# Patient Record
Sex: Female | Born: 1935 | Race: White | Hispanic: No | Smoking: Never smoker
Health system: Southern US, Community
[De-identification: ages and names within clinical notes are randomized; demographics above are authoritative.]

## PROBLEM LIST (undated history)

## (undated) DIAGNOSIS — M1990 Unspecified osteoarthritis, unspecified site: Secondary | ICD-10-CM

## (undated) DIAGNOSIS — I10 Essential (primary) hypertension: Secondary | ICD-10-CM

## (undated) DIAGNOSIS — E785 Hyperlipidemia, unspecified: Secondary | ICD-10-CM

## (undated) DIAGNOSIS — J449 Chronic obstructive pulmonary disease, unspecified: Secondary | ICD-10-CM

## (undated) DIAGNOSIS — R233 Spontaneous ecchymoses: Secondary | ICD-10-CM

## (undated) DIAGNOSIS — I509 Heart failure, unspecified: Secondary | ICD-10-CM

## (undated) DIAGNOSIS — J4489 Other specified chronic obstructive pulmonary disease: Secondary | ICD-10-CM

## (undated) DIAGNOSIS — O2230 Deep phlebothrombosis in pregnancy, unspecified trimester: Secondary | ICD-10-CM

## (undated) DIAGNOSIS — I7300 Raynaud's syndrome without gangrene: Secondary | ICD-10-CM

## (undated) DIAGNOSIS — K5090 Crohn's disease, unspecified, without complications: Secondary | ICD-10-CM

## (undated) DIAGNOSIS — R238 Other skin changes: Secondary | ICD-10-CM

## (undated) DIAGNOSIS — F419 Anxiety disorder, unspecified: Secondary | ICD-10-CM

## (undated) DIAGNOSIS — R251 Tremor, unspecified: Secondary | ICD-10-CM

## (undated) DIAGNOSIS — R609 Edema, unspecified: Secondary | ICD-10-CM

## (undated) DIAGNOSIS — H9190 Unspecified hearing loss, unspecified ear: Secondary | ICD-10-CM

## (undated) DIAGNOSIS — R0689 Other abnormalities of breathing: Secondary | ICD-10-CM

## (undated) DIAGNOSIS — K219 Gastro-esophageal reflux disease without esophagitis: Secondary | ICD-10-CM

## (undated) HISTORY — PX: KNEE SURGERY: SHX244

## (undated) HISTORY — DX: Edema, unspecified: R60.9

## (undated) HISTORY — DX: Spontaneous ecchymoses: R23.3

## (undated) HISTORY — DX: Unspecified hearing loss, unspecified ear: H91.90

## (undated) HISTORY — DX: Other abnormalities of breathing: R06.89

## (undated) HISTORY — DX: Tremor, unspecified: R25.1

## (undated) HISTORY — DX: Other skin changes: R23.8

## (undated) HISTORY — PX: ABDOMINAL HYSTERECTOMY: SHX81

## (undated) HISTORY — PX: FOOT SURGERY: SHX648

---

## 2003-09-03 ENCOUNTER — Other Ambulatory Visit: Payer: Self-pay

## 2004-10-01 ENCOUNTER — Ambulatory Visit: Payer: Self-pay | Admitting: Ophthalmology

## 2004-10-08 ENCOUNTER — Ambulatory Visit: Payer: Self-pay | Admitting: Ophthalmology

## 2004-10-31 ENCOUNTER — Emergency Department: Payer: Self-pay | Admitting: Emergency Medicine

## 2005-01-30 ENCOUNTER — Ambulatory Visit: Payer: Self-pay | Admitting: Surgery

## 2005-01-30 ENCOUNTER — Other Ambulatory Visit: Payer: Self-pay

## 2005-01-31 ENCOUNTER — Ambulatory Visit: Payer: Self-pay | Admitting: Surgery

## 2006-04-16 ENCOUNTER — Emergency Department: Payer: Self-pay | Admitting: Emergency Medicine

## 2006-04-16 ENCOUNTER — Other Ambulatory Visit: Payer: Self-pay

## 2009-01-03 ENCOUNTER — Ambulatory Visit: Payer: Self-pay | Admitting: Ophthalmology

## 2009-01-17 ENCOUNTER — Ambulatory Visit: Payer: Self-pay | Admitting: Ophthalmology

## 2013-03-03 ENCOUNTER — Encounter: Payer: Self-pay | Admitting: Podiatry

## 2013-03-03 ENCOUNTER — Ambulatory Visit (INDEPENDENT_AMBULATORY_CARE_PROVIDER_SITE_OTHER): Payer: Medicare Other | Admitting: Podiatry

## 2013-03-03 VITALS — BP 109/55 | HR 71 | Resp 16 | Ht 63.0 in | Wt 220.0 lb

## 2013-03-03 DIAGNOSIS — M2040 Other hammer toe(s) (acquired), unspecified foot: Secondary | ICD-10-CM

## 2013-03-03 DIAGNOSIS — M7751 Other enthesopathy of right foot: Secondary | ICD-10-CM

## 2013-03-03 DIAGNOSIS — M7750 Other enthesopathy of unspecified foot: Secondary | ICD-10-CM

## 2013-03-03 DIAGNOSIS — B351 Tinea unguium: Secondary | ICD-10-CM

## 2013-03-03 NOTE — Progress Notes (Signed)
Victoria Johnston presents today with a chief complaint of painful hammertoe deformity second digit of the right foot she's also complaining of painful  elongated and sharply incurvated nails.  Objective: Pulses are palpable bilateral. Sharp incurvated nail margins are thick yellow dystrophic onychomycotic 1 through 5 bilateral. She also has a painfully rigid hammertoe deformity second digit of the right foot with overlying erythema and what appears to be a bursa at the second PIPJ.  Assessment: Painful capsulitis osteoarthritis second digit right. Pain in limb secondary to onychomycosis.  Plan: We discussed the etiology pathology conservative or surgical therapies. At this point we injected para-articular only about the second PIPJ with dexamethasone and local anesthetic. This rendered immediate relief to her. We also debrided her nails 1 through 5 bilateral is a covered service secondary to pain. Followup with her in 3 months.

## 2013-05-31 ENCOUNTER — Ambulatory Visit: Payer: Medicare Other | Admitting: Podiatry

## 2013-06-30 ENCOUNTER — Ambulatory Visit (INDEPENDENT_AMBULATORY_CARE_PROVIDER_SITE_OTHER): Payer: Medicare Other | Admitting: Podiatry

## 2013-06-30 VITALS — BP 105/58 | HR 83 | Resp 16 | Ht 64.0 in | Wt 204.0 lb

## 2013-06-30 DIAGNOSIS — B351 Tinea unguium: Secondary | ICD-10-CM

## 2013-06-30 DIAGNOSIS — M79609 Pain in unspecified limb: Secondary | ICD-10-CM

## 2013-06-30 NOTE — Progress Notes (Signed)
She presents today with a chief complaint of painful toenails one through 5 bilateral.  Objective: Vital signs are stable she is alert and oriented x3. Pulses are palpable bilateral. Nails are thick yellow dystrophic lytic mycotic and painful palpation. Assessment: Pain in limb secondary to onychomycosis 1 through 5 bilateral.  Plan: Debridement of nails 1 through 5 bilateral.

## 2013-10-06 ENCOUNTER — Ambulatory Visit: Payer: Medicare Other | Admitting: Podiatry

## 2015-01-20 ENCOUNTER — Inpatient Hospital Stay
Admission: EM | Admit: 2015-01-20 | Discharge: 2015-01-25 | DRG: 481 | Disposition: A | Discharge status: Skilled Nursing Facility | Payer: Medicare Other | Attending: Internal Medicine | Admitting: Internal Medicine

## 2015-01-20 ENCOUNTER — Emergency Department: Payer: Medicare Other

## 2015-01-20 DIAGNOSIS — M4806 Spinal stenosis, lumbar region: Secondary | ICD-10-CM | POA: Diagnosis present

## 2015-01-20 DIAGNOSIS — N390 Urinary tract infection, site not specified: Secondary | ICD-10-CM | POA: Diagnosis present

## 2015-01-20 DIAGNOSIS — Z79899 Other long term (current) drug therapy: Secondary | ICD-10-CM | POA: Diagnosis not present

## 2015-01-20 DIAGNOSIS — W19XXXA Unspecified fall, initial encounter: Secondary | ICD-10-CM | POA: Diagnosis present

## 2015-01-20 DIAGNOSIS — T148XXA Other injury of unspecified body region, initial encounter: Secondary | ICD-10-CM

## 2015-01-20 DIAGNOSIS — Z86718 Personal history of other venous thrombosis and embolism: Secondary | ICD-10-CM

## 2015-01-20 DIAGNOSIS — S72401A Unspecified fracture of lower end of right femur, initial encounter for closed fracture: Secondary | ICD-10-CM | POA: Diagnosis present

## 2015-01-20 DIAGNOSIS — Z90710 Acquired absence of both cervix and uterus: Secondary | ICD-10-CM

## 2015-01-20 DIAGNOSIS — G629 Polyneuropathy, unspecified: Secondary | ICD-10-CM | POA: Diagnosis present

## 2015-01-20 DIAGNOSIS — K219 Gastro-esophageal reflux disease without esophagitis: Secondary | ICD-10-CM | POA: Diagnosis present

## 2015-01-20 DIAGNOSIS — Z8249 Family history of ischemic heart disease and other diseases of the circulatory system: Secondary | ICD-10-CM

## 2015-01-20 DIAGNOSIS — Y792 Prosthetic and other implants, materials and accessory orthopedic devices associated with adverse incidents: Secondary | ICD-10-CM | POA: Diagnosis present

## 2015-01-20 DIAGNOSIS — D62 Acute posthemorrhagic anemia: Secondary | ICD-10-CM | POA: Diagnosis not present

## 2015-01-20 DIAGNOSIS — K5010 Crohn's disease of large intestine without complications: Secondary | ICD-10-CM | POA: Diagnosis present

## 2015-01-20 DIAGNOSIS — F0390 Unspecified dementia without behavioral disturbance: Secondary | ICD-10-CM | POA: Diagnosis present

## 2015-01-20 DIAGNOSIS — M1990 Unspecified osteoarthritis, unspecified site: Secondary | ICD-10-CM | POA: Diagnosis present

## 2015-01-20 DIAGNOSIS — E785 Hyperlipidemia, unspecified: Secondary | ICD-10-CM | POA: Diagnosis present

## 2015-01-20 DIAGNOSIS — T84049A Periprosthetic fracture around unspecified internal prosthetic joint, initial encounter: Secondary | ICD-10-CM | POA: Diagnosis present

## 2015-01-20 DIAGNOSIS — Z7901 Long term (current) use of anticoagulants: Secondary | ICD-10-CM | POA: Diagnosis not present

## 2015-01-20 DIAGNOSIS — I5022 Chronic systolic (congestive) heart failure: Secondary | ICD-10-CM | POA: Diagnosis present

## 2015-01-20 DIAGNOSIS — Y92009 Unspecified place in unspecified non-institutional (private) residence as the place of occurrence of the external cause: Secondary | ICD-10-CM | POA: Diagnosis not present

## 2015-01-20 DIAGNOSIS — S7290XA Unspecified fracture of unspecified femur, initial encounter for closed fracture: Secondary | ICD-10-CM | POA: Diagnosis present

## 2015-01-20 DIAGNOSIS — G250 Essential tremor: Secondary | ICD-10-CM | POA: Diagnosis present

## 2015-01-20 DIAGNOSIS — S7291XA Unspecified fracture of right femur, initial encounter for closed fracture: Secondary | ICD-10-CM

## 2015-01-20 DIAGNOSIS — E039 Hypothyroidism, unspecified: Secondary | ICD-10-CM | POA: Diagnosis present

## 2015-01-20 DIAGNOSIS — H9190 Unspecified hearing loss, unspecified ear: Secondary | ICD-10-CM | POA: Diagnosis present

## 2015-01-20 DIAGNOSIS — Z8781 Personal history of (healed) traumatic fracture: Secondary | ICD-10-CM

## 2015-01-20 DIAGNOSIS — Z9889 Other specified postprocedural states: Secondary | ICD-10-CM

## 2015-01-20 DIAGNOSIS — D684 Acquired coagulation factor deficiency: Secondary | ICD-10-CM | POA: Diagnosis present

## 2015-01-20 LAB — COMPREHENSIVE METABOLIC PANEL
ALT: 10 U/L — ABNORMAL LOW (ref 14–54)
AST: 15 U/L (ref 15–41)
Albumin: 2.5 g/dL — ABNORMAL LOW (ref 3.5–5.0)
Alkaline Phosphatase: 73 U/L (ref 38–126)
Anion gap: 3 — ABNORMAL LOW (ref 5–15)
BILIRUBIN TOTAL: 0.7 mg/dL (ref 0.3–1.2)
BUN: 17 mg/dL (ref 6–20)
CO2: 25 mmol/L (ref 22–32)
CREATININE: 1.19 mg/dL — AB (ref 0.44–1.00)
Calcium: 7.8 mg/dL — ABNORMAL LOW (ref 8.9–10.3)
Chloride: 110 mmol/L (ref 101–111)
GFR, EST AFRICAN AMERICAN: 46 mL/min — AB (ref >60)
GFR, EST NON AFRICAN AMERICAN: 39 mL/min — AB (ref >60)
Glucose, Bld: 119 mg/dL — ABNORMAL HIGH (ref 65–99)
POTASSIUM: 3.6 mmol/L (ref 3.5–5.1)
Sodium: 138 mmol/L (ref 135–145)
TOTAL PROTEIN: 3.9 g/dL — AB (ref 6.5–8.1)

## 2015-01-20 LAB — CBC WITH DIFFERENTIAL/PLATELET
BASOS ABS: 0 10*3/uL (ref 0–0.1)
Basophils Relative: 0 %
EOS ABS: 0 10*3/uL (ref 0–0.7)
EOS PCT: 0 %
HCT: 30.7 % — ABNORMAL LOW (ref 35.0–47.0)
Hemoglobin: 10.2 g/dL — ABNORMAL LOW (ref 12.0–16.0)
Lymphocytes Relative: 12 %
Lymphs Abs: 0.9 10*3/uL — ABNORMAL LOW (ref 1.0–3.6)
MCH: 33 pg (ref 26.0–34.0)
MCHC: 33.3 g/dL (ref 32.0–36.0)
MCV: 98.9 fL (ref 80.0–100.0)
Monocytes Absolute: 0.6 10*3/uL (ref 0.2–0.9)
Monocytes Relative: 9 %
Neutro Abs: 5.4 10*3/uL (ref 1.4–6.5)
Neutrophils Relative %: 79 %
PLATELETS: 107 10*3/uL — AB (ref 150–440)
RBC: 3.1 MIL/uL — AB (ref 3.80–5.20)
RDW: 13.6 % (ref 11.5–14.5)
WBC: 6.9 10*3/uL (ref 3.6–11.0)

## 2015-01-20 LAB — URINALYSIS COMPLETE WITH MICROSCOPIC (ARMC ONLY)
BILIRUBIN URINE: NEGATIVE
Glucose, UA: NEGATIVE mg/dL
HGB URINE DIPSTICK: NEGATIVE
KETONES UR: NEGATIVE mg/dL
NITRITE: NEGATIVE
PH: 5 (ref 5.0–8.0)
Protein, ur: 30 mg/dL — AB
Specific Gravity, Urine: 1.021 (ref 1.005–1.030)

## 2015-01-20 LAB — PROTIME-INR
INR: 1.97
Prothrombin Time: 22.6 seconds — ABNORMAL HIGH (ref 11.4–15.0)

## 2015-01-20 LAB — ABO/RH: ABO/RH(D): A POS

## 2015-01-20 MED ORDER — ACETAMINOPHEN 325 MG PO TABS: 650.0000 mg | ORAL_TABLET | Freq: Four times a day (QID) | ORAL | Status: DC | PRN

## 2015-01-20 MED ORDER — LORAZEPAM 1 MG PO TABS
1.0000 mg | ORAL_TABLET | Freq: Three times a day (TID) | ORAL | Status: DC | PRN
  Administered 2015-01-24: 1 mg via ORAL
  Filled 2015-01-20: qty 1

## 2015-01-20 MED ORDER — PANTOPRAZOLE SODIUM 40 MG PO TBEC
40.0000 mg | DELAYED_RELEASE_TABLET | Freq: Every day | ORAL | Status: DC
  Administered 2015-01-20 – 2015-01-21 (×2): 40 mg via ORAL
  Filled 2015-01-20 (×2): qty 1

## 2015-01-20 MED ORDER — SENNA 8.6 MG PO TABS
1.0000 | ORAL_TABLET | Freq: Two times a day (BID) | ORAL | Status: DC
  Administered 2015-01-20 – 2015-01-25 (×10): 8.6 mg via ORAL
  Filled 2015-01-20 (×10): qty 1

## 2015-01-20 MED ORDER — SODIUM CHLORIDE 0.9 % IV SOLN
INTRAVENOUS | Status: DC
  Administered 2015-01-20 – 2015-01-23 (×5): via INTRAVENOUS

## 2015-01-20 MED ORDER — MAGNESIUM OXIDE 400 (241.3 MG) MG PO TABS
400.0000 mg | ORAL_TABLET | Freq: Every day | ORAL | Status: DC
  Administered 2015-01-20 – 2015-01-25 (×5): 400 mg via ORAL
  Filled 2015-01-20 (×10): qty 1

## 2015-01-20 MED ORDER — ACETAMINOPHEN 650 MG RE SUPP: 650.0000 mg | Freq: Four times a day (QID) | RECTAL | Status: DC | PRN

## 2015-01-20 MED ORDER — TOPIRAMATE 100 MG PO TABS
100.0000 mg | ORAL_TABLET | Freq: Two times a day (BID) | ORAL | Status: DC
  Administered 2015-01-20 – 2015-01-22 (×4): 100 mg via ORAL
  Filled 2015-01-20 (×6): qty 1

## 2015-01-20 MED ORDER — POLYETHYLENE GLYCOL 3350 17 G PO PACK: 17.0000 g | PACK | Freq: Every day | ORAL | Status: DC | PRN

## 2015-01-20 MED ORDER — HYDROCODONE-ACETAMINOPHEN 5-325 MG PO TABS
0.5000 | ORAL_TABLET | Freq: Every morning | ORAL | Status: DC
  Filled 2015-01-20: qty 1

## 2015-01-20 MED ORDER — TRAMADOL HCL 50 MG PO TABS
50.0000 mg | ORAL_TABLET | Freq: Two times a day (BID) | ORAL | Status: DC
  Administered 2015-01-20 (×2): 50 mg via ORAL
  Filled 2015-01-20 (×3): qty 1

## 2015-01-20 MED ORDER — SODIUM CHLORIDE 0.9 % IJ SOLN
10.0000 mL | Freq: Two times a day (BID) | INTRAMUSCULAR | Status: DC
  Administered 2015-01-21 – 2015-01-25 (×6): 10 mL via INTRAVENOUS

## 2015-01-20 MED ORDER — PROPRANOLOL HCL 40 MG PO TABS
40.0000 mg | ORAL_TABLET | Freq: Two times a day (BID) | ORAL | Status: DC
  Administered 2015-01-20 – 2015-01-24 (×8): 40 mg via ORAL
  Filled 2015-01-20 (×11): qty 1

## 2015-01-20 MED ORDER — HALOPERIDOL 2 MG PO TABS
1.0000 mg | ORAL_TABLET | Freq: Every day | ORAL | Status: DC
  Administered 2015-01-20 – 2015-01-24 (×5): 1 mg via ORAL
  Filled 2015-01-20 (×3): qty 1
  Filled 2015-01-20: qty 0.5
  Filled 2015-01-20 (×2): qty 1

## 2015-01-20 MED ORDER — FOLIC ACID 1 MG PO TABS
1.0000 mg | ORAL_TABLET | Freq: Every day | ORAL | Status: DC
  Administered 2015-01-20 – 2015-01-25 (×5): 1 mg via ORAL
  Filled 2015-01-20 (×5): qty 1

## 2015-01-20 MED ORDER — SULFASALAZINE 500 MG PO TABS
500.0000 mg | ORAL_TABLET | Freq: Two times a day (BID) | ORAL | Status: DC
  Administered 2015-01-20 – 2015-01-25 (×9): 500 mg via ORAL
  Filled 2015-01-20 (×11): qty 1

## 2015-01-20 MED ORDER — MORPHINE SULFATE (PF) 2 MG/ML IV SOLN: 1.0000 mg | INTRAVENOUS | Status: DC | PRN

## 2015-01-20 MED ORDER — DOCUSATE SODIUM 100 MG PO CAPS
100.0000 mg | ORAL_CAPSULE | Freq: Two times a day (BID) | ORAL | Status: DC
  Administered 2015-01-20 – 2015-01-25 (×10): 100 mg via ORAL
  Filled 2015-01-20 (×10): qty 1

## 2015-01-20 MED ORDER — HYDROMORPHONE HCL 1 MG/ML IJ SOLN
0.5000 mg | INTRAMUSCULAR | Status: DC | PRN
  Administered 2015-01-20: 0.5 mg via INTRAVENOUS
  Filled 2015-01-20 (×4): qty 1

## 2015-01-20 MED ORDER — MORPHINE SULFATE (PF) 2 MG/ML IV SOLN
2.0000 mg | INTRAVENOUS | Status: DC | PRN
  Administered 2015-01-20 – 2015-01-21 (×4): 2 mg via INTRAVENOUS
  Filled 2015-01-20 (×4): qty 1

## 2015-01-20 MED ORDER — HYDROMORPHONE HCL 1 MG/ML IJ SOLN
0.5000 mg | Freq: Once | INTRAMUSCULAR | Status: AC
  Administered 2015-01-20: 0.5 mg via INTRAMUSCULAR

## 2015-01-20 MED ORDER — NITROGLYCERIN 0.4 MG SL SUBL: 0.4000 mg | SUBLINGUAL_TABLET | SUBLINGUAL | Status: DC | PRN

## 2015-01-20 MED ORDER — LEVOTHYROXINE SODIUM 75 MCG PO TABS
75.0000 ug | ORAL_TABLET | Freq: Every day | ORAL | Status: DC
  Administered 2015-01-23 – 2015-01-24 (×2): 75 ug via ORAL
  Filled 2015-01-20 (×2): qty 1

## 2015-01-20 NOTE — H&P (Signed)
[facility] - [place] at [facility]   PATIENT NAME: Victoria Johnston    MR#:  [id number]  DATE OF BIRTH:  [birth year]  DATE OF ADMISSION:  01/20/2015  PRIMARY CARE PHYSICIAN: Danella Penton., MD   REQUESTING/REFERRING PHYSICIAN: DR Carollee Massed  CHIEF COMPLAINT:   Right knee pain after fall at home today HISTORY OF PRESENT ILLNESS:  Victoria Johnston  is a 81 y.o. female with a known history of hypothyroidism, lumbar spinal stenosis, hyperlipidemia, Crohn's disease, GERD, DVT on Coumadin, chronic systolic congestive heart failure comes to the emergency room from home accompanied by her daughter after she had a mechanical fall at home. Patient's daughter reports patient was trying to get to her walker likely tangled her foot fell down and came to the emergency room with right knee pain but was found to have distal femur fracture. Patient has bilateral knee prosthesis secondary to DJD. She is on Coumadin due to her DVT. Her INR is 1.9. Patient denies any chest pain shortness of breath. She was in her usual normal routine.  PAST MEDICAL HISTORY:   Past Medical History  Diagnosis Date  . Swelling   . Hearing loss   . Difficulty breathing   . Bruises easily   . Tremors of nervous system     PAST SURGICAL HISTOIRY:   Past Surgical History  Procedure Laterality Date  . Abdominal hysterectomy    . Knee surgery Bilateral   . Foot surgery Bilateral     SOCIAL HISTORY:   Social History  Substance Use Topics  . Smoking status: Never Smoker   . Smokeless tobacco: Never Used  . Alcohol Use: No    FAMILY HISTORY:  HTN  DRUG ALLERGIES:  Obtained from [facility]  Codeine, Lexapro, Lyrica, Mysoline  REVIEW OF SYSTEMS:  Review of Systems  Constitutional: Negative for fever, chills and diaphoresis.  HENT: Negative for congestion, ear pain, hearing loss, nosebleeds and sore throat.   Eyes: Negative for blurred vision, double vision, photophobia and  pain.  Respiratory: Negative for sputum production, shortness of breath, wheezing and stridor.   Cardiovascular: Negative for chest pain, orthopnea, claudication and leg swelling.  Gastrointestinal: Negative for heartburn and abdominal pain.  Genitourinary: Negative for dysuria and frequency.  Musculoskeletal: Positive for joint pain and falls. Negative for back pain and neck pain.  Skin: Negative for rash.  Neurological: Positive for weakness. Negative for tingling, sensory change, speech change, focal weakness, seizures and headaches.  Endo/Heme/Allergies: Does not bruise/bleed easily.  Psychiatric/Behavioral: Negative for suicidal ideas, memory loss and substance abuse. The patient is not nervous/anxious.   All other systems reviewed and are negative.    MEDICATIONS AT HOME:   Prior to Admission medications   Medication Sig Start Date End Date Taking? Authorizing Provider  ALPRAZolam Prudy Feeler) 0.5 MG tablet  02/20/13   Historical Provider, MD  HYDROcodone-acetaminophen (NORCO/VICODIN) 5-325 MG per tablet  02/24/13   Historical Provider, MD  KLOR-CON M20 20 MEQ tablet  01/14/13   Historical Provider, MD  pantoprazole (PROTONIX) 40 MG tablet  01/25/13   Historical Provider, MD  propranolol (INDERAL) 40 MG tablet  01/01/13   Historical Provider, MD  sulfaSALAzine (AZULFIDINE) 500 MG tablet  02/25/13   Historical Provider, MD  topiramate (TOPAMAX) 100 MG tablet  02/12/13   Historical Provider, MD  torsemide (DEMADEX) 20 MG tablet  01/17/13   Historical Provider, MD  traMADol Janean Sark) 50 MG tablet  02/04/13   Historical Provider, MD  warfarin (COUMADIN) 5 MG tablet  02/20/13   Historical Provider, MD      VITAL SIGNS:  Blood pressure 112/41, pulse 60, temperature 98.3 F (36.8 C), temperature source Oral, resp. rate 16, height  (1.702 m), weight 90.719 kg (200 lb), SpO2 95 %.  PHYSICAL EXAMINATION:  GENERAL:  81 y.o.-year-old patient lying in the bed with no acute distress.  EYES:  Pupils equal, round, reactive to light and accommodation. No scleral icterus. Extraocular muscles intact.  HEENT: Head atraumatic, normocephalic. Oropharynx and nasopharynx clear.  NECK:  Supple, no jugular venous distention. No thyroid enlargement, no tenderness.  LUNGS: Normal breath sounds bilaterally, no wheezing, rales,rhonchi or crepitation. No use of accessory muscles of respiration.  CARDIOVASCULAR: S1, S2 normal. No murmurs, rubs, or gallops.  ABDOMEN: Soft, nontender, nondistended. Bowel sounds present. No organomegaly or mass.  EXTREMITIES: chronic+ pedal edema, cyanosis, or clubbing. Foot defromities-chronic Right knee swollen NEUROLOGIC: grossly intact no focal deficit PSYCHIATRIC: The patient is alert and oriented x 3.  SKIN: No obvious rash, lesion, or ulcer.   LABORATORY PANEL:   CBC  Recent Labs Lab 01/20/15 1025  WBC 6.9  HGB 10.2*  HCT 30.7*  PLT 107*   ------------------------------------------------------------------------------------------------------------------  Chemistries   Recent Labs Lab 01/20/15 1025  NA 138  K 3.6  CL 110  CO2 25  GLUCOSE 119*  BUN 17  CREATININE 1.19*  CALCIUM 7.8*  AST 15  ALT 10*  ALKPHOS 73  BILITOT 0.7   ------------------------------------------------------------------------------------------------------------------  Cardiac Enzymes No results for input(s): TROPONINI in the last 168 hours. ------------------------------------------------------------------------------------------------------------------  RADIOLOGY:  Dg Knee 1-2 Views Right  01/20/2015   CLINICAL DATA:  Status post fall today. Right knee pain Initial encounter.  EXAM: RIGHT KNEE - 1-2 VIEW  COMPARISON:  None.  FINDINGS: The patient has a right total knee arthroplasty in place. There is a fracture of the distal right femur which is oblique in orientation extending from approximately 17 cm above the lateral femoral condyle to the level of the joint  replacement on the medial side. There is approximately 1/2 shaft width posterior displacement of the distal fragment and fragment override of 2.5 cm. Bones are osteopenic.  IMPRESSION: Acute periprosthetic distal right femur fracture as described.  Osteopenia.   Electronically Signed   By: Drusilla Kanner M.D.   On: 01/20/2015 12:27   Dg Hip Unilat  With Pelvis 2-3 Views Right  01/20/2015   CLINICAL DATA:  Patient brought in via EMS from home. Patient was walking back from bathroom and fell. Fall was unwitnessed. Patient is alert and oriented and denies LOC. Right hip in shorter than left and externally rotated.  EXAM: DG HIP (WITH OR WITHOUT PELVIS) 2-3V RIGHT  COMPARISON:  None.  FINDINGS: There is no convincing fracture. There is no bone lesion. Bones diffusely demineralized.  Hip joints, SI joints and symphysis pubis are normally aligned.  IMPRESSION: No fracture or dislocation.   Electronically Signed   By: Amie Portland M.D.   On: 01/20/2015 11:00    EKG:  Sinus bradycardia HR 57 q waves inferior lead  IMPRESSION AND PLAN:   81 year old Victoria Johnston with past medical history of hyperlipidemia, DJD, DVT on Coumadin, systolic congestive heart failure chronic comes to the emergency room after she had a mechanical fall at home she is being admitted with  1. Acute right distal femur fracture, periprosthetic status post mechanical fall at home. Orthopedic Dr. Rosita Kea has been informed. Risks and benefits discussed with patient and patient's daughter was present emergency room.  Patient has had several orthopedic surgeries in the past. She had no issues with anesthesia or postop complications. Patient is at intermediate risk for surgery. IV when necessary pain meds.  2. Acquired coagulopathy secondary to history of DVT. INR is 1.9. We'll hold off on Coumadin. Hoping INR will drift down by tomorrow. PT/INR in the morning.  3. Chronic systolic congestive heart failure. Patient currently is not in  heart failure Sats are 99% on room air We'll continue her cardiac meds.  4. Chronic essential tremors  5. Hypothyroidism continue Synthroid  6. DVT prophylaxis per orthopedic patient currently is on Coumadin and will keep that on hold. Resume postop when okay with orthopedic     All the records are reviewed and case discussed with ED provider. Management plans discussed with the patient, family and they are in agreement.  CODE STATUS: Full  TOTAL TIME TAKING CARE OF THIS PATIENT: 50 minutes.    Yailine Ballard M.D on 01/20/2015 at 12:55 PM  Between 7am to 6pm - Pager - [phone]  After 6pm go to [email or web address] - password [email or web address] [facility]  [facility]  Office  [phone]  CC: Primary care physician; Danella Penton., MD

## 2015-01-20 NOTE — Progress Notes (Signed)
Pt iv site infiltrated d/c IV, attempted to restart IV. No access obtained using vein finder. Received order for PICC line.

## 2015-01-20 NOTE — ED Notes (Signed)
Patient brought in via EMS from home.  Patient was walking back from bathroom and fell.  Fall was unwitnessed.  Patient is alert and oriented and denies LOC.  Right hip in shorter than left and externally rotated.

## 2015-01-20 NOTE — Progress Notes (Signed)
Dr. Allena Katz ordered midline and d/c'd PICC line

## 2015-01-20 NOTE — Progress Notes (Signed)
Pts. Having severe pain and no IV site. Dr. Allena Katz ordered Dilaudid 0.5mg  IM one time.

## 2015-01-20 NOTE — ED Provider Notes (Signed)
[facility] Emergency Department Provider Note  ____________________________________________  Time seen: 1055  I have reviewed the triage vital signs and the nursing notes.   HISTORY  Chief Complaint Fall     HPI Victoria Johnston is a 81 y.o. female who fell this morning at her home.She was walking back from the bathroom and fell. She lives at home with support from her daughter and son. The son covers the night shift and the daughter looks after her mother during the day. The daughter is here now and able to provide good history. She reports her mother has been doing okay, she is fairly independent, they're able to get out of the house and Novocain W the other day. She sometimes uses a walker.  Patient is complaining of pain in the right leg, specifically her right knee on my exam.  The patient denies hitting her head or losing consciousness during this fall.  She is notably tremulous, which the daughter reports is not unusual for her.    Past Medical History  Diagnosis Date  . Swelling   . Hearing loss   . Difficulty breathing   . Bruises easily   . Tremors of nervous system     There are no active problems to display for this patient.   Past Surgical History  Procedure Laterality Date  . Abdominal hysterectomy    . Knee surgery Bilateral   . Foot surgery Bilateral     Current Outpatient Rx  Name  Route  Sig  Dispense  Refill  . ALPRAZolam (XANAX) 0.5 MG tablet               . HYDROcodone-acetaminophen (NORCO/VICODIN) 5-325 MG per tablet               . KLOR-CON M20 20 MEQ tablet               . pantoprazole (PROTONIX) 40 MG tablet               . propranolol (INDERAL) 40 MG tablet               . sulfaSALAzine (AZULFIDINE) 500 MG tablet               . topiramate (TOPAMAX) 100 MG tablet               . torsemide (DEMADEX) 20 MG tablet               . traMADol (ULTRAM) 50 MG tablet                . warfarin (COUMADIN) 5 MG tablet                 Allergies Review of patient's allergies indicates no known allergies.  History reviewed. No pertinent family history.  Social History Social History  Substance Use Topics  . Smoking status: Never Smoker   . Smokeless tobacco: Never Used  . Alcohol Use: No    Review of Systems  Constitutional: Negative for fever. ENT: Negative for sore throat. Cardiovascular: Negative for chest pain. Respiratory: Negative for shortness of breath. Gastrointestinal: Negative for abdominal pain, vomiting and diarrhea. Genitourinary: Negative for dysuria. Musculoskeletal: Pain right knee. See history of present illness Skin: Negative for rash. Neurological: Negative for headaches. Notable for tremor. See history of present illness   10-point ROS otherwise negative.  ____________________________________________   PHYSICAL EXAM:  VITAL SIGNS: ED Triage Vitals  Enc Vitals Group  BP --      Pulse --      Resp --      Temp --      Temp src --      SpO2 --      Weight --      Height --      Head Cir --      Peak Flow --      Pain Score --      Pain Loc --      Pain Edu? --      Excl. in GC? --     Constitutional:  Alert and oriented. Well appearing and in no distress. ENT   Head: Normocephalic and atraumatic.   Nose: No congestion/rhinnorhea.   Mouth/Throat: Mucous membranes are moist. Cardiovascular: Normal rate, regular rhythm, no murmur noted Respiratory:  Normal respiratory effort, no tachypnea.    Breath sounds are clear and equal bilaterally.  Gastrointestinal: Soft and nontender. No distention.  Back: No muscle spasm, no tenderness, no CVA tenderness. Musculoskeletal: No deformity noted. Nontender with normal range of motion in all extremities.  No noted edema. Neurologic:  Normal speech and language. No gross focal neurologic deficits are appreciated.  Skin:  Skin is warm, dry. No rash  noted. Psychiatric: Mood and affect are normal. Speech and behavior are normal.  ____________________________________________    LABS (pertinent positives/negatives)  Insert the labs.  ____________________________________________   EKG   ____________________________________________    RADIOLOGY  Right hip: IMPRESSION: No fracture or dislocation  Right knee: Fracture, oblique and somewhat displaced, distal right femur. Prior knee replacement is noted.  IJanalyn Harder, personally looked at this film for interpretation.  ____________________________________________   PROCEDURES    ____________________________________________   INITIAL IMPRESSION / ASSESSMENT AND PLAN / ED COURSE  Pertinent labs & imaging results that were available during my care of the patient were reviewed by me and considered in my medical decision making (see chart for details).   Alert 81 year old female with notable tremor. She fell this morning and has pain in her right knee. Initially, with the complaint of pain in the right hip and with rotation of the leg, and x-ray of the right hip was performed. This is normal. Will add on the x-ray for the right knee now.  ----------------------------------------- 12:30 PM on 01/20/2015 -----------------------------------------  The patient has a fracture of the distal femur. I have called and spoken with the nurse in the operating room where Dr. Rosita Kea is an left details for the consultation.  He has requested that I consult medicine for the admission itself.  I have discussed case with the patient's daughter as well. We will allow her to take her regular medicines and begin some pain medication.  I discussed the case with Dr. Allena Katz for admission.  ____________________________________________   FINAL CLINICAL IMPRESSION(S) / ED DIAGNOSES  Final diagnoses:  Femur fracture, right, closed, initial encounter   fall    Darien Ramus,  MD 01/20/15 [phone]

## 2015-01-20 NOTE — Consult Note (Signed)
Right periprosthetic distal femur fracture, on Coumadin. Plan ORIF when protime closer to normal. Hope for Sunday.

## 2015-01-21 LAB — PROTIME-INR
INR: 2.38
PROTHROMBIN TIME: 26.1 s — AB (ref 11.4–15.0)

## 2015-01-21 MED ORDER — HYDROMORPHONE HCL 1 MG/ML IJ SOLN
0.5000 mg | Freq: Once | INTRAMUSCULAR | Status: AC
  Administered 2015-01-21: 0.5 mg via INTRAVENOUS

## 2015-01-21 MED ORDER — VITAMIN K1 10 MG/ML IJ SOLN
10.0000 mg | Freq: Once | INTRAVENOUS | Status: AC
  Administered 2015-01-21: 10 mg via INTRAVENOUS
  Filled 2015-01-21: qty 1

## 2015-01-21 MED ORDER — TRAMADOL HCL 50 MG PO TABS
50.0000 mg | ORAL_TABLET | Freq: Four times a day (QID) | ORAL | Status: DC | PRN
  Administered 2015-01-21: 50 mg via ORAL
  Filled 2015-01-21: qty 1

## 2015-01-21 NOTE — Progress Notes (Signed)
Coming on to my shift, pt. Was in severe pain in left leg with no IV site or no PO pain meds. Dr. Allena Katz was notified and gave order to give pt. Dilaudid 0.5mg  IM once. PICC team is to be here at anytime to place midline cath. In pt. Pt. Resting quietly after IV pain medication.

## 2015-01-21 NOTE — Progress Notes (Signed)
Pts. IV line was unhooked at the end and I didn't realize this when I pushed the dilaudid and it wasted on the sheets.

## 2015-01-21 NOTE — Progress Notes (Signed)
INR increased today.  Recheck in am.  Plan surgery tomorrow if INR<1.4.

## 2015-01-21 NOTE — Progress Notes (Signed)
[facility] - [place] at [facility]                                                                                                                                                                                            Patient Demographics   Victoria Johnston, is a 81 y.o. female, DOB - Apr 07, 1924, ION:[id number]  Admit date - 01/20/2015   Admitting Physician Enedina Finner, MD  Outpatient Primary MD for the patient is Danella Penton., MD   LOS - 1  Subjective: Patient complains of pain in the leg but denies any other complaints no chest pain or shortness of breath     Review of Systems:   CONSTITUTIONAL: No documented fever. No fatigue, weakness. No weight gain, no weight loss.  EYES: No blurry or double vision.  ENT: No tinnitus. No postnasal drip. No redness of the oropharynx.  RESPIRATORY: No cough, no wheeze, no hemoptysis. No dyspnea.  CARDIOVASCULAR: No chest pain. No orthopnea. No palpitations. No syncope.  GASTROINTESTINAL: No nausea, no vomiting or diarrhea. No abdominal pain. No melena or hematochezia.  GENITOURINARY: No dysuria or hematuria.  ENDOCRINE: No polyuria or nocturia. No heat or cold intolerance.  HEMATOLOGY: No anemia. No bruising. No bleeding.  INTEGUMENTARY: No rashes. No lesions.  MUSCULOSKELETAL: No arthritis. No swelling. No gout.  NEUROLOGIC: No numbness, tingling, or ataxia. No seizure-type activity.  PSYCHIATRIC: No anxiety. No insomnia. No ADD.    Vitals:   Filed Vitals:   01/20/15 2121 01/21/15 0432 01/21/15 0749 01/21/15 0939  BP:  110/49 103/35 119/50  Pulse: 58 58 55 58  Temp:  99.7 F (37.6 C) 99.1 F (37.3 C)   TempSrc:  Oral Oral   Resp:  20 18   Height:      Weight:      SpO2: 100% 99% 100%     Wt Readings from Last 3 Encounters:  01/20/15 90.719 kg (200 lb)  06/30/13 92.534 kg (204 lb)  03/03/13 99.791 kg (220 lb)     Intake/Output Summary (Last 24 hours) at 01/21/15 1223 Last data filed at 01/21/15  0900  Gross per 24 hour  Intake 1217.5 ml  Output    750 ml  Net  467.5 ml    Physical Exam:   GENERAL: Pleasant-appearing in no apparent distress.  HEAD, EYES, EARS, NOSE AND THROAT: Atraumatic, normocephalic. Extraocular muscles are intact. Pupils equal and reactive to light. Sclerae anicteric. No conjunctival injection. No oro-pharyngeal erythema.  NECK: Supple. There is no jugular venous distention. No bruits, no lymphadenopathy, no thyromegaly.  HEART: Regular rate and rhythm,. No murmurs, no rubs, no clicks.  LUNGS: Clear to auscultation bilaterally. No rales or rhonchi. No wheezes.  ABDOMEN: Soft, flat, nontender, nondistended. Has good bowel sounds. No hepatosplenomegaly appreciated.  EXTREMITIES: No evidence of any cyanosis, clubbing, or peripheral edema.  +2 pedal and radial pulses bilaterally.  NEUROLOGIC: The patient is alert, awake, and oriented x3 with no focal motor or sensory deficits appreciated bilaterally.  SKIN: Moist and warm with no rashes appreciated.  Psych: Not anxious, depressed LN: No inguinal LN enlargement    Antibiotics   Anti-infectives    None      Medications   Scheduled Meds: . docusate sodium  100 mg Oral BID  . folic acid  1 mg Oral Daily  . haloperidol  1 mg Oral QHS  . levothyroxine  75 mcg Oral QAC breakfast  . magnesium oxide  400 mg Oral Daily  . pantoprazole  40 mg Oral Daily  . phytonadione (VITAMIN K) IV  10 mg Intravenous Once  . propranolol  40 mg Oral BID  . senna  1 tablet Oral BID  . sodium chloride  10 mL Intravenous Q12H  . sulfaSALAzine  500 mg Oral BID  . topiramate  100 mg Oral BID   Continuous Infusions: . sodium chloride 75 mL/hr at 01/21/15 1022   PRN Meds:.acetaminophen **OR** acetaminophen, HYDROmorphone (DILAUDID) injection, LORazepam, morphine injection, nitroGLYCERIN, polyethylene glycol, traMADol   Data Review:   Micro Results No results found for this or any previous visit (from the past 240  hour(s)).  Radiology Reports Dg Knee 1-2 Views Right  01/20/2015   CLINICAL DATA:  Status post fall today. Right knee pain Initial encounter.  EXAM: RIGHT KNEE - 1-2 VIEW  COMPARISON:  None.  FINDINGS: The patient has a right total knee arthroplasty in place. There is a fracture of the distal right femur which is oblique in orientation extending from approximately 17 cm above the lateral femoral condyle to the level of the joint replacement on the medial side. There is approximately 1/2 shaft width posterior displacement of the distal fragment and fragment override of 2.5 cm. Bones are osteopenic.  IMPRESSION: Acute periprosthetic distal right femur fracture as described.  Osteopenia.   Electronically Signed   By: Drusilla Kanner M.D.   On: 01/20/2015 12:27   Dg Chest Port 1 View  01/20/2015   CLINICAL DATA:  Preop chest radiograph prior to femur surgery.  EXAM: PORTABLE CHEST 1 VIEW  COMPARISON:  04/16/2006  FINDINGS: Cardiac silhouette is mildly enlarged. No convincing mediastinal or hilar masses or evidence of adenopathy.  Lungs are clear.  No pleural effusion or pneumothorax.  Bony thorax is diffusely demineralized but grossly intact.  IMPRESSION: No acute cardiopulmonary disease.   Electronically Signed   By: Amie Portland M.D.   On: 01/20/2015 13:08   Dg Hip Unilat  With Pelvis 2-3 Views Right  01/20/2015   CLINICAL DATA:  Patient brought in via EMS from home. Patient was walking back from bathroom and fell. Fall was unwitnessed. Patient is alert and oriented and denies LOC. Right hip in shorter than left and externally rotated.  EXAM: DG HIP (WITH OR WITHOUT PELVIS) 2-3V RIGHT  COMPARISON:  None.  FINDINGS: There is no convincing fracture. There is no bone lesion. Bones diffusely demineralized.  Hip joints, SI joints and symphysis pubis are normally aligned.  IMPRESSION: No fracture or dislocation.   Electronically Signed   By: Amie Portland M.D.   On: 01/20/2015 11:00     CBC  Recent  Labs Lab  01/20/15 1025  WBC 6.9  HGB 10.2*  HCT 30.7*  PLT 107*  MCV 98.9  MCH 33.0  MCHC 33.3  RDW 13.6  LYMPHSABS 0.9*  MONOABS 0.6  EOSABS 0.0  BASOSABS 0.0    Chemistries   Recent Labs Lab 01/20/15 1025  NA 138  K 3.6  CL 110  CO2 25  GLUCOSE 119*  BUN 17  CREATININE 1.19*  CALCIUM 7.8*  AST 15  ALT 10*  ALKPHOS 73  BILITOT 0.7   ------------------------------------------------------------------------------------------------------------------ estimated creatinine clearance is 37 mL/min (by C-G formula based on Cr of 1.19). ------------------------------------------------------------------------------------------------------------------ No results for input(s): HGBA1C in the last 72 hours. ------------------------------------------------------------------------------------------------------------------ No results for input(s): CHOL, HDL, LDLCALC, TRIG, CHOLHDL, LDLDIRECT in the last 72 hours. ------------------------------------------------------------------------------------------------------------------ No results for input(s): TSH, T4TOTAL, T3FREE, THYROIDAB in the last 72 hours.  Invalid input(s): FREET3 ------------------------------------------------------------------------------------------------------------------ No results for input(s): VITAMINB12, FOLATE, FERRITIN, TIBC, IRON, RETICCTPCT in the last 72 hours.  Coagulation profile  Recent Labs Lab 01/20/15 1025 01/21/15 0438  INR 1.97 2.38    No results for input(s): DDIMER in the last 72 hours.  Cardiac Enzymes No results for input(s): CKMB, TROPONINI, MYOGLOBIN in the last 168 hours.  Invalid input(s): CK ------------------------------------------------------------------------------------------------------------------ Invalid input(s): POCBNP    Assessment & Plan   81 year old Mrs. Fehnel with past medical history of hyperlipidemia, DJD, DVT on Coumadin, systolic congestive heart  failure chronic comes to the emergency room after she had a mechanical fall at home she is being admitted with  1. Acute right distal femur fracture, periprosthetic status post mechanical fall at home. Once INR is acceptable orthopedic is planning to operate on the patient.    2. Acquired coagulopathy secondary to history of DVT. INR is actually higher even after holding Coumadin I will give dose of vitamin K follow INR in the a.m.  3. Chronic systolic congestive heart failure. Patient currently is not in heart failure Sats are 99% on room air We'll continue her cardiac meds.  4. Chronic essential tremors  5. Hypothyroidism continue Synthroid  6. DVT prophylaxis per orthopedic postop recommend resuming her Coumadin     Code Status Orders        Start     Ordered   01/20/15 1427  Full code   Continuous     01/20/15 1426           Consults  orthopedic   DVT Prophylaxis  SCDs  Lab Results  Component Value Date   PLT 107* 01/20/2015     Time Spent in minutes   35 minutes Auburn Bilberry M.D on 01/21/2015 at 12:23 PM  Between 7am to 6pm - Pager - [phone]  After 6pm go to [email or web address] - password [email or web address] [facility]  [facility]   Office  [phone]

## 2015-01-21 NOTE — Progress Notes (Signed)
PT Cancellation Note  Patient Details Name: Victoria Johnston MRN: [id number] DOB: 1923-11-03   Cancelled Treatment:    Reason Eval/Treat Not Completed: Medical issues which prohibited therapy Pt awaiting ORIF surgery on Sunday.  Awaiting new orders with weight bearing precautions.  Gisele A Long 01/21/2015, 9:12 AM

## 2015-01-21 NOTE — Progress Notes (Signed)
Alert and oriented. VSS. Pt. Has had a very restless night with c/o severe pain in the left leg. Pain has been treated with IV pain meds and repositioning the pt. Pills whole with water. Foley patent with good output. O2 at 2 liters chronic. Pt. Has knee immobilizer to right lower extremity. Heels elevated off of bed with towel rolls. Scheduled for surgery on Sunday. Resting quietly at this time.

## 2015-01-21 NOTE — Clinical Social Work Note (Signed)
Clinical Social Work Assessment  Patient Details  Name: Victoria Johnston MRN: [id number] Date of Birth: July 26, 1923  Date of referral:  01/21/15               Reason for consult:  Facility Placement                Permission sought to share information with:  Facility Medical sales representative, Family Supports Permission granted to share information::  Yes, Verbal Permission Granted  Name::     Younger sister [phone].  Agency::  [facility]   Relationship::     Contact Information:     Housing/Transportation Living arrangements for the past 2 months:  Single Family Home Source of Information:  Patient, Other (Comment Required) (sibling) Patient Interpreter Needed:  None Criminal Activity/Legal Involvement Pertinent to Current Situation/Hospitalization:  No - Comment as needed Significant Relationships:  Adult Children, Siblings Lives with:  Adult Children, Other (Comment) (and adult grandson but he workd 3rd shift and sleeps most of the day) Do you feel safe going back to the place where you live?  No Need for family participation in patient care:  Yes (Comment)  Care giving concerns:  Current concerns are that pt may need to go into LTC after rehab.  Per pt's sister, pt had had more than one fall at home.     Social Worker assessment / plan:  CSW spoke to pt's sister and pt.  Pt is hard of hearing but oriented.  They are both in agreement with SNF placement.  Pt had previously been to [facility], and would like to return if possible.  Per pt's sister pt lives with her daughter and her grandson.  Pt's sister expressed that she feels that pt needs more support than pt's daughter and grand son are giving at home.  Pt's sister also stated that she was the [facility].  CWS will continue to follow.  FL2 is started but will complete after surgery.  Employment status:  Retired, Disabled (Comment on whether or not currently receiving Disability) Insurance information:  Medicare PT  Recommendations:    Information / Referral to community resources:  Skilled Nursing Facility  Patient/Family's Response to care:  Pt and pt's sister thanked CSW for speaking to them about SNF placement.  Both were in agreement with SNF placement if needed post surgery.  Patient/Family's Understanding of and Emotional Response to Diagnosis, Current Treatment, and Prognosis:  Both verbalized their understanding of possible need for SNF and were in agreement with SNF placement.    Emotional Assessment Appearance:  Appears stated age Attitude/Demeanor/Rapport:   (polite and pleasent) Affect (typically observed):  Appropriate Orientation:  Oriented to Self, Oriented to Place, Oriented to  Time, Oriented to Situation (hard of hearing) Alcohol / Substance use:  Never Used Psych involvement (Current and /or in the community):  No (Comment)  Discharge Needs  Concerns to be addressed:  Care Coordination, Other (Comment Required (possible need for LTC ) Readmission within the last 30 days:  No Current discharge risk:  Physical Impairment Barriers to Discharge:  Continued Medical Work up   [employer], LCSW 01/21/2015, 3:57 PM

## 2015-01-22 ENCOUNTER — Inpatient Hospital Stay: Payer: Medicare Other | Admitting: Anesthesiology

## 2015-01-22 ENCOUNTER — Encounter
Admission: EM | Disposition: A | Discharge status: Skilled Nursing Facility | Payer: Self-pay | Source: Home / Self Care | Attending: Internal Medicine

## 2015-01-22 ENCOUNTER — Inpatient Hospital Stay: Payer: Medicare Other

## 2015-01-22 HISTORY — PX: ORIF FEMUR FRACTURE: SHX2119

## 2015-01-22 LAB — BASIC METABOLIC PANEL
ANION GAP: 3 — AB (ref 5–15)
BUN: 13 mg/dL (ref 6–20)
CHLORIDE: 112 mmol/L — AB (ref 101–111)
CO2: 22 mmol/L (ref 22–32)
Calcium: 7.6 mg/dL — ABNORMAL LOW (ref 8.9–10.3)
Creatinine, Ser: 0.94 mg/dL (ref 0.44–1.00)
GFR calc Af Amer: >60 mL/min (ref >60)
GFR, EST NON AFRICAN AMERICAN: 52 mL/min — AB (ref >60)
GLUCOSE: 108 mg/dL — AB (ref 65–99)
POTASSIUM: 3.7 mmol/L (ref 3.5–5.1)
Sodium: 137 mmol/L (ref 135–145)

## 2015-01-22 LAB — PROTIME-INR
INR: 1.37
INR: 1.31
PROTHROMBIN TIME: 17.1 s — AB (ref 11.4–15.0)
Prothrombin Time: 16.5 seconds — ABNORMAL HIGH (ref 11.4–15.0)

## 2015-01-22 SURGERY — OPEN REDUCTION INTERNAL FIXATION (ORIF) DISTAL FEMUR FRACTURE
Anesthesia: Spinal | Laterality: Right

## 2015-01-22 MED ORDER — PHENOL 1.4 % MT LIQD: 1.0000 | OROMUCOSAL | Status: DC | PRN

## 2015-01-22 MED ORDER — MENTHOL 3 MG MT LOZG: 1.0000 | LOZENGE | OROMUCOSAL | Status: DC | PRN

## 2015-01-22 MED ORDER — LACTATED RINGERS IV SOLN
INTRAVENOUS | Status: DC | PRN
  Administered 2015-01-22: 10:00:00 via INTRAVENOUS

## 2015-01-22 MED ORDER — GLYCOPYRROLATE 0.2 MG/ML IJ SOLN
INTRAMUSCULAR | Status: DC | PRN
  Administered 2015-01-22: 0.2 mg via INTRAVENOUS

## 2015-01-22 MED ORDER — WARFARIN - PHARMACIST DOSING INPATIENT: Freq: Every day | Status: DC

## 2015-01-22 MED ORDER — OXYCODONE HCL 5 MG PO TABS
5.0000 mg | ORAL_TABLET | ORAL | Status: DC | PRN
  Administered 2015-01-22 – 2015-01-24 (×2): 5 mg via ORAL
  Filled 2015-01-22 (×2): qty 1

## 2015-01-22 MED ORDER — CEFAZOLIN SODIUM-DEXTROSE 2-3 GM-% IV SOLR
2.0000 g | Freq: Four times a day (QID) | INTRAVENOUS | Status: AC
  Administered 2015-01-22 – 2015-01-23 (×3): 2 g via INTRAVENOUS
  Filled 2015-01-22 (×3): qty 50

## 2015-01-22 MED ORDER — ALBUTEROL SULFATE (2.5 MG/3ML) 0.083% IN NEBU: 2.5000 mg | INHALATION_SOLUTION | Freq: Four times a day (QID) | RESPIRATORY_TRACT | Status: DC | PRN

## 2015-01-22 MED ORDER — WARFARIN SODIUM 2.5 MG PO TABS
5.0000 mg | ORAL_TABLET | Freq: Every day | ORAL | Status: DC
  Administered 2015-01-22 – 2015-01-24 (×3): 5 mg via ORAL
  Filled 2015-01-22 (×3): qty 2

## 2015-01-22 MED ORDER — MUPIROCIN 2 % EX OINT
1.0000 | TOPICAL_OINTMENT | Freq: Two times a day (BID) | CUTANEOUS | Status: DC
Start: 2015-01-22 — End: 2015-01-23
  Filled 2015-01-22: qty 22

## 2015-01-22 MED ORDER — MIDAZOLAM HCL 2 MG/2ML IJ SOLN
INTRAMUSCULAR | Status: DC | PRN
  Administered 2015-01-22 (×2): 1 mg via INTRAVENOUS

## 2015-01-22 MED ORDER — ONDANSETRON HCL 4 MG PO TABS: 4.0000 mg | ORAL_TABLET | Freq: Four times a day (QID) | ORAL | Status: DC | PRN

## 2015-01-22 MED ORDER — CHLORHEXIDINE GLUCONATE CLOTH 2 % EX PADS: 6.0000 | MEDICATED_PAD | Freq: Every day | CUTANEOUS | Status: DC

## 2015-01-22 MED ORDER — ONDANSETRON HCL 4 MG/2ML IJ SOLN
4.0000 mg | Freq: Four times a day (QID) | INTRAMUSCULAR | Status: DC | PRN
  Administered 2015-01-23: 4 mg via INTRAVENOUS
  Filled 2015-01-22: qty 2

## 2015-01-22 MED ORDER — EPHEDRINE SULFATE 50 MG/ML IJ SOLN
INTRAMUSCULAR | Status: DC | PRN
  Administered 2015-01-22: 20 mg via INTRAVENOUS

## 2015-01-22 MED ORDER — KETAMINE HCL 50 MG/ML IJ SOLN
INTRAMUSCULAR | Status: DC | PRN
  Administered 2015-01-22 (×4): 25 mg via INTRAMUSCULAR

## 2015-01-22 MED ORDER — BISACODYL 10 MG RE SUPP
10.0000 mg | Freq: Every day | RECTAL | Status: DC | PRN
  Administered 2015-01-24: 10 mg via RECTAL
  Filled 2015-01-22: qty 1

## 2015-01-22 MED ORDER — METOCLOPRAMIDE HCL 5 MG/ML IJ SOLN: 5.0000 mg | Freq: Three times a day (TID) | INTRAMUSCULAR | Status: DC | PRN

## 2015-01-22 MED ORDER — PROPOFOL 10 MG/ML IV BOLUS
INTRAVENOUS | Status: DC | PRN
  Administered 2015-01-22: 100 mg via INTRAVENOUS

## 2015-01-22 MED ORDER — CEFAZOLIN SODIUM-DEXTROSE 2-3 GM-% IV SOLR
INTRAVENOUS | Status: DC | PRN
  Administered 2015-01-22: 2 g via INTRAVENOUS

## 2015-01-22 MED ORDER — ACETAMINOPHEN 650 MG RE SUPP: 650.0000 mg | Freq: Four times a day (QID) | RECTAL | Status: DC | PRN

## 2015-01-22 MED ORDER — FENTANYL CITRATE (PF) 100 MCG/2ML IJ SOLN
INTRAMUSCULAR | Status: DC | PRN
  Administered 2015-01-22 (×2): 50 ug via INTRAVENOUS

## 2015-01-22 MED ORDER — METOCLOPRAMIDE HCL 5 MG PO TABS: 5.0000 mg | ORAL_TABLET | Freq: Three times a day (TID) | ORAL | Status: DC | PRN

## 2015-01-22 MED ORDER — ACETAMINOPHEN 325 MG PO TABS
650.0000 mg | ORAL_TABLET | Freq: Four times a day (QID) | ORAL | Status: DC | PRN
  Administered 2015-01-22 – 2015-01-24 (×3): 650 mg via ORAL
  Filled 2015-01-22 (×3): qty 2

## 2015-01-22 SURGICAL SUPPLY — 43 items
BIT DRILL 2.5 NCB (BIT) ×2 IMPLANT
BIT DRILL 2.5MM NCB (BIT) ×1
BIT DRILL 4.3 (BIT) ×2
BIT DRILL 4.3MM (BIT) ×1
BIT DRILL 4.3X300MM (BIT) ×1 IMPLANT
CANISTER SUCT 1200ML W/VALVE (MISCELLANEOUS) ×3 IMPLANT
CAP LOCK NCB (Cap) ×21 IMPLANT
CHLORAPREP W/TINT 26ML (MISCELLANEOUS) ×3 IMPLANT
DRAPE C-ARM XRAY 36X54 (DRAPES) ×6 IMPLANT
DRAPE C-ARMOR (DRAPES) ×3 IMPLANT
DRSG OPSITE POSTOP 4X12 (GAUZE/BANDAGES/DRESSINGS) ×3 IMPLANT
GAUZE PETRO XEROFOAM 1X8 (MISCELLANEOUS) ×3 IMPLANT
GAUZE SPONGE 4X4 12PLY STRL (GAUZE/BANDAGES/DRESSINGS) ×3 IMPLANT
GLOVE SURG ORTHO 9.0 STRL STRW (GLOVE) ×3 IMPLANT
GOWN SPECIALTY ULTRA XL (MISCELLANEOUS) ×3 IMPLANT
GOWN STRL REUS W/ TWL LRG LVL3 (GOWN DISPOSABLE) ×1 IMPLANT
GOWN STRL REUS W/TWL LRG LVL3 (GOWN DISPOSABLE) ×2
HEMOVAC 400ML (MISCELLANEOUS) ×3
IMMOB KNEE 24 THIGH 24 443303 (SOFTGOODS) ×3 IMPLANT
IMMOBILIZER SHDR XL LX WHT (SOFTGOODS) ×3 IMPLANT
K-WIRE 2.0 (WIRE) ×6
K-WIRE FXSTD 280X2XNS SS (WIRE) ×3
KIT DRAIN HEMOVAC JP 7FR 400ML (MISCELLANEOUS) ×1 IMPLANT
KIT RM TURNOVER STRD PROC AR (KITS) ×3 IMPLANT
KWIRE FXSTD 280X2XNS SS (WIRE) ×3 IMPLANT
NEEDLE FILTER BLUNT 18X 1/2SAF (NEEDLE) ×2
NEEDLE FILTER BLUNT 18X1 1/2 (NEEDLE) ×1 IMPLANT
NS IRRIG 500ML POUR BTL (IV SOLUTION) ×3 IMPLANT
PACK HIP PROSTHESIS (MISCELLANEOUS) ×3 IMPLANT
PAD GROUND ADULT SPLIT (MISCELLANEOUS) ×3 IMPLANT
PLATE FEM DIST NCB PP 278MM (Plate) ×3 IMPLANT
SCREW CANCELLOUS 5.0X65 (Screw) ×6 IMPLANT
SCREW CANNCELLOUS LEG NCB 5X50 (Screw) ×6 IMPLANT
SCREW CANNCELLOUS5.0X55 (Screw) ×3 IMPLANT
SCREW NCB 5.0X34MM (Screw) ×3 IMPLANT
SCREW NCB 5.0X36MM (Screw) ×12 IMPLANT
SCREW NCB 5.0X70MM (Screw) ×3 IMPLANT
STAPLER SKIN PROX 35W (STAPLE) ×3 IMPLANT
SUT VIC AB 0 CT1 36 (SUTURE) ×6 IMPLANT
SUT VIC AB 2-0 CT1 27 (SUTURE) ×4
SUT VIC AB 2-0 CT1 TAPERPNT 27 (SUTURE) ×2 IMPLANT
SYR 5ML LL (SYRINGE) ×3 IMPLANT
TAPE MICROFOAM 4IN (TAPE) ×3 IMPLANT

## 2015-01-22 NOTE — Anesthesia Preprocedure Evaluation (Addendum)
Anesthesia Evaluation  Patient identified by MRN, date of birth, ID band Patient confused    Reviewed: Allergy & Precautions, NPO status , Patient's Chart, lab work & pertinent test results  History of Anesthesia Complications Negative for: history of anesthetic complications  Airway Mallampati: II       Dental   Pulmonary neg pulmonary ROS,           Cardiovascular hypertension, Pt. on medications and Pt. on home beta blockers +CHF (hx) and + DVT       Neuro/Psych  Neuromuscular disease (tremor, spinal stenosis)    GI/Hepatic GERD  Medicated,  Endo/Other  Hypothyroidism   Renal/GU      Musculoskeletal   Abdominal   Peds  Hematology   Anesthesia Other Findings   Reproductive/Obstetrics                           Anesthesia Physical Anesthesia Plan  ASA: III  Anesthesia Plan: Spinal   Post-op Pain Management:    Induction: Intravenous  Airway Management Planned: Nasal Cannula  Additional Equipment:   Intra-op Plan:   Post-operative Plan:   Informed Consent:   Plan Discussed with:   Anesthesia Plan Comments:        Anesthesia Quick Evaluation

## 2015-01-22 NOTE — Anesthesia Postprocedure Evaluation (Signed)
  Anesthesia Post-op Note  Patient: Victoria Johnston  Procedure(s) Performed: Procedure(s): OPEN REDUCTION INTERNAL FIXATION (ORIF) DISTAL FEMUR FRACTURE (Right)  Anesthesia type:Spinal  Patient location: PACU  Post pain: Pain level controlled  Post assessment: Post-op Vital signs reviewed, Patient's Cardiovascular Status Stable, Respiratory Function Stable, Patent Airway and No signs of Nausea or vomiting  Post vital signs: Reviewed and stable  Last Vitals:  Filed Vitals:   01/22/15 1831  BP: 104/73  Pulse: 94  Temp: 37.2 C  Resp: 18    Level of consciousness: awake, alert  and patient cooperative  Complications: No apparent anesthesia complications

## 2015-01-22 NOTE — Transfer of Care (Signed)
Immediate Anesthesia Transfer of Care Note  Patient: Victoria Johnston  Procedure(s) Performed: Procedure(s): OPEN REDUCTION INTERNAL FIXATION (ORIF) DISTAL FEMUR FRACTURE (Right)  Patient Location: PACU  Anesthesia Type:General  Level of Consciousness: sedated  Airway & Oxygen Therapy: Patient Spontanous Breathing and Patient connected to face mask oxygen  Post-op Assessment: Report given to RN  Post vital signs: Reviewed  Last Vitals:  Filed Vitals:   01/22/15 0901  BP: 141/53  Pulse: 61  Temp:   Resp:     Complications: No apparent anesthesia complications

## 2015-01-22 NOTE — Progress Notes (Signed)
Inr is down and headed for surgery, no change in plans, please call me for medical complications today

## 2015-01-22 NOTE — Progress Notes (Addendum)
ANTICOAGULATION CONSULT NOTE - Initial Consult  Pharmacy Consult for Warfarin Indication: VTE prophylaxis  No Known Allergies  Patient Measurements: Height:  (170.2 cm) Weight: 200 lb (90.719 kg) IBW/kg (Calculated) : 61.6 Heparin Dosing Weight:   Vital Signs: Temp: 98 F (36.7 C) (09/25 1435) Temp Source: Axillary (09/25 1435) BP: 128/63 mmHg (09/25 1435) Pulse Rate: 48 (09/25 1436)  Labs:  Recent Labs  01/20/15 1025 01/21/15 0438 01/22/15 0349 01/22/15 0750  HGB 10.2*  --   --   --   HCT 30.7*  --   --   --   PLT 107*  --   --   --   LABPROT 22.6* 26.1* 17.1* 16.5*  INR 1.97 2.38 1.37 1.31  CREATININE 1.19*  --  0.94  --     Estimated Creatinine Clearance: 46.9 mL/min (by C-G formula based on Cr of 0.94).   Medical History: Past Medical History  Diagnosis Date  . Swelling   . Hearing loss   . Difficulty breathing   . Bruises easily   . Tremors of nervous system     Medications:  Prescriptions prior to admission  Medication Sig Dispense Refill Last Dose  . albuterol (PROVENTIL HFA;VENTOLIN HFA) 108 (90 BASE) MCG/ACT inhaler Inhale 2 puffs into the lungs every 6 (six) hours as needed for wheezing or shortness of breath.   PRN at PRN  . BIOTIN PO Take 1 capsule by mouth daily at 12 noon.   01/19/2015 at Unknown time  . folic acid (FOLVITE) 1 MG tablet Take 1 mg by mouth daily.   01/19/2015 at Unknown time  . haloperidol (HALDOL) 1 MG tablet Take 2 mg by mouth at bedtime.   01/19/2015 at Unknown time  . HYDROcodone-acetaminophen (NORCO/VICODIN) 5-325 MG per tablet Take 0.5 tablets by mouth at bedtime.   01/20/2015 at 0000  . Iron-Vitamin C (VITRON-C) 65-125 MG TABS Take 1 tablet by mouth daily.   01/20/2015 at Unknown time  . levothyroxine (SYNTHROID, LEVOTHROID) 75 MCG tablet Take 75 mcg by mouth daily before breakfast.   01/19/2015 at Unknown time  . LORazepam (ATIVAN) 1 MG tablet Take 1 mg by mouth 3 (three) times daily.    01/19/2015 at Unknown time  .  magnesium oxide (MAG-OX) 400 MG tablet Take 400 mg by mouth daily.   01/19/2015 at Unknown time  . nitroGLYCERIN (NITROSTAT) 0.4 MG SL tablet Place 0.4 mg under the tongue every 5 (five) minutes as needed for chest pain.   PRN at PRN  . pantoprazole (PROTONIX) 40 MG tablet Take 40 mg by mouth 2 (two) times daily.   01/19/2015 at Unknown time  . potassium chloride SA (K-DUR,KLOR-CON) 20 MEQ tablet Take 20 mEq by mouth 2 (two) times daily.     . propranolol (INDERAL) 40 MG tablet Take 40 mg by mouth daily.   01/19/2015 at Unsure   . sulfaSALAzine (AZULFIDINE) 500 MG tablet Take 500-1,000 mg by mouth 3 (three) times daily. Pt takes two in the morning, two at noon, and one at night.   01/19/2015 at Unknown time  . topiramate (TOPAMAX) 100 MG tablet Take 50 mg by mouth 2 (two) times daily.   01/19/2015 at Unknown time  . torsemide (DEMADEX) 20 MG tablet Take 30 mg by mouth daily.     . traMADol (ULTRAM) 50 MG tablet Take 25 mg by mouth daily.   01/19/2015 at Unsure   . vitamin B-12 (CYANOCOBALAMIN) 1000 MCG tablet Take 1,000 mcg by mouth daily at  12 noon.   01/19/2015 at Unknown time  . warfarin (COUMADIN) 5 MG tablet Take 5 mg by mouth at bedtime.   01/19/2015 at 0000    Assessment: Pharmacy consulted to dose warfarin in this 81 year old female S/P surgical repair of femur fracture.   MD wants to resume warfarin for DVT prophylaxis.  Goal of Therapy:  INR 2-3  Plan:  Will restart Warfarin 5 mg PO daily on 9/25 and recheck INR on 9/26 with AM labs.  Will monitor H&H daily.   Robbins,Jason D 01/22/2015,3:15 PM

## 2015-01-22 NOTE — OR Nursing (Signed)
Waiting for x-ray to finish to patient room at this time

## 2015-01-22 NOTE — Anesthesia Procedure Notes (Signed)
Anesthesia Procedure Note Pt brought to OR. Numerous attempt to place spinal block unsuccessful secondary to body habitus and inability to find landmarks.

## 2015-01-22 NOTE — Op Note (Signed)
01/20/2015 - 01/22/2015  1:16 PM  PATIENT:  Victoria Johnston  81 y.o. female  PRE-OPERATIVE DIAGNOSIS:  RIGHT DISTAL FEMUR FRACTURE, periprosthetic  POST-OPERATIVE DIAGNOSIS:  Same  PROCEDURE:  Procedure(s): OPEN REDUCTION INTERNAL FIXATION (ORIF) DISTAL FEMUR FRACTURE (Right)  SURGEON: Leitha Schuller, MD  ASSISTANTS: None  ANESTHESIA:   general and An after failed spinal attempts  EBL:  Total I/O In: 800 [I.V.:800] Out: 450 [Urine:200; Blood:250]  BLOOD ADMINISTERED:none  DRAINS: none   LOCAL MEDICATIONS USED:  NONE  SPECIMEN:  No Specimen  DISPOSITION OF SPECIMEN:  N/A  COUNTS:  YES  TOURNIQUET:   none  IMPLANTS: Zimmer locking periprosthetic distal femur plate with multiple screws and locking caps  DICTATION: .Dragon Dictation patient was brought to the operating room and attempted spinal anesthesia was undertaken but was unsuccessful. Gen. anesthesia was then obtained of the right leg was prepped and draped in usual sterile fashion. After patient identification and timeout procedures were completed an oblique incision was made over the distal femur and the IT band was incised with the distal femur exposed. Plate length was determined based on intraoperative x-ray and a plate was slid subcutaneously up the femur with a pin locking it distally of the second most proximal screw hole was filled and it was centered on the lateral view. Going distally the distal fragment stayed extended more than desired and different manipulations were performed to try to get this in a better position and took took a clamp from anterior to posterior to get acceptable alignment after this was obtained distal screw holes were filled with multiple screws. Next the 3 proximal screws were placed using the outrigger guide so therefore proximal screws nonlocking and multiple locking screws distally with acceptable alignment both AP and lateral projections and caps were placed over the distal screws to  make them locking screws and the fracture was stable through range of motion. The wounds were irrigated and then closed with a 0 Vicryl to close the IT band 2-0 Vicryl subcutaneously and skin staples honeycomb dressing then applied  PLAN OF CARE: Continues inpatient  PATIENT DISPOSITION:  PACU - hemodynamically stable.

## 2015-01-22 NOTE — Progress Notes (Signed)
Hospitalist notified patient pulled out midline PICC. New order plaed to reinsert in the am

## 2015-01-23 ENCOUNTER — Encounter: Payer: Self-pay | Admitting: Orthopedic Surgery

## 2015-01-23 LAB — CBC
HEMATOCRIT: 21.4 % — AB (ref 35.0–47.0)
HEMOGLOBIN: 7.1 g/dL — AB (ref 12.0–16.0)
MCH: 32.2 pg (ref 26.0–34.0)
MCHC: 33.1 g/dL (ref 32.0–36.0)
MCV: 97.3 fL (ref 80.0–100.0)
Platelets: 151 10*3/uL (ref 150–440)
RBC: 2.2 MIL/uL — AB (ref 3.80–5.20)
RDW: 13.1 % (ref 11.5–14.5)
WBC: 7.6 10*3/uL (ref 3.6–11.0)

## 2015-01-23 LAB — BASIC METABOLIC PANEL
Anion gap: 5 (ref 5–15)
BUN: 14 mg/dL (ref 6–20)
CHLORIDE: 110 mmol/L (ref 101–111)
CO2: 22 mmol/L (ref 22–32)
CREATININE: 1.03 mg/dL — AB (ref 0.44–1.00)
Calcium: 7.4 mg/dL — ABNORMAL LOW (ref 8.9–10.3)
GFR calc Af Amer: 54 mL/min — ABNORMAL LOW (ref >60)
GFR calc non Af Amer: 47 mL/min — ABNORMAL LOW (ref >60)
GLUCOSE: 95 mg/dL (ref 65–99)
Potassium: 3.8 mmol/L (ref 3.5–5.1)
SODIUM: 137 mmol/L (ref 135–145)

## 2015-01-23 LAB — PROTIME-INR
INR: 1.47
PROTHROMBIN TIME: 18 s — AB (ref 11.4–15.0)

## 2015-01-23 LAB — PREPARE RBC (CROSSMATCH)

## 2015-01-23 MED ORDER — SODIUM CHLORIDE 0.9 % IV SOLN
Freq: Once | INTRAVENOUS | Status: AC
  Administered 2015-01-23: 11:00:00 via INTRAVENOUS

## 2015-01-23 MED ORDER — TOPIRAMATE 25 MG PO TABS
50.0000 mg | ORAL_TABLET | Freq: Two times a day (BID) | ORAL | Status: DC
  Administered 2015-01-23 – 2015-01-25 (×5): 50 mg via ORAL
  Filled 2015-01-23 (×4): qty 2

## 2015-01-23 MED ORDER — INFLUENZA VAC SPLIT QUAD 0.5 ML IM SUSY
0.5000 mL | PREFILLED_SYRINGE | INTRAMUSCULAR | Status: AC
Start: 2015-01-24 — End: 2015-01-25
  Administered 2015-01-25: 0.5 mL via INTRAMUSCULAR
  Filled 2015-01-23: qty 0.5

## 2015-01-23 MED ORDER — PANTOPRAZOLE SODIUM 40 MG IV SOLR
40.0000 mg | Freq: Two times a day (BID) | INTRAVENOUS | Status: DC
  Administered 2015-01-23 (×2): 40 mg via INTRAVENOUS
  Filled 2015-01-23 (×2): qty 40

## 2015-01-23 MED ORDER — PNEUMOCOCCAL VAC POLYVALENT 25 MCG/0.5ML IJ INJ
0.5000 mL | INJECTION | INTRAMUSCULAR | Status: DC
  Filled 2015-01-23: qty 0.5

## 2015-01-23 NOTE — Progress Notes (Signed)
Clinical Social Worker (CSW) met with patient and her daughter Maudie Mercury [phone] was at bedside. CSW presented bed offers. Patient chose [facility]. CSW made [facility] admissions coordinator at [facility] aware of accepted bed offer. Plan is for patient to D/C Wednesday 01/25/15 to [facility]. MD is aware of above. CSW will continue to follow and assist as needed.   Blima Rich, [place] [phone]

## 2015-01-23 NOTE — Progress Notes (Signed)
   Subjective: 1 Day Post-Op Procedure(s) (LRB): OPEN REDUCTION INTERNAL FIXATION (ORIF) DISTAL FEMUR FRACTURE (Right) Patient reports pain as mild.   Patient is well, and has had no acute complaints or problems We will start therapy today.  Plan is to go Skilled nursing facility after hospital stay.  Objective: Vital signs in last 24 hours: Temp:  [97.8 F (36.6 C)-101.6 F (38.7 C)] 99.3 F (37.4 C) (09/26 0437) Pulse Rate:  [38-132] 61 (09/26 0437) Resp:  [11-20] 20 (09/26 0437) BP: (103-179)/(42-123) 103/44 mmHg (09/26 0437) SpO2:  [83 %-100 %] 98 % (09/26 0437)  Intake/Output from previous day: 09/25 0701 - 09/26 0700 In: 4422.5 [I.V.:4272.5; IV Piggyback:150] Out: 775 [Urine:525; Blood:250] Intake/Output this shift:     Recent Labs  01/20/15 1025 01/23/15 0405  HGB 10.2* 7.1*    Recent Labs  01/20/15 1025 01/23/15 0405  WBC 6.9 7.6  RBC 3.10* 2.20*  HCT 30.7* 21.4*  PLT 107* 151    Recent Labs  01/22/15 0349 01/23/15 0405  NA 137 137  K 3.7 3.8  CL 112* 110  CO2 22 22  BUN 13 14  CREATININE 0.94 1.03*  GLUCOSE 108* 95  CALCIUM 7.6* 7.4*    Recent Labs  01/22/15 0750 01/23/15 0405  INR 1.31 1.47    EXAM General - Patient is Alert, Appropriate and Oriented Extremity - Neurovascular intact Sensation intact distally Intact pulses distally Dorsiflexion/Plantar flexion intact Dressing - dressing C/D/I and no drainage. Knee immobilizer intact Motor Function - intact, moving foot and toes well on exam.   Past Medical History  Diagnosis Date  . Swelling   . Hearing loss   . Difficulty breathing   . Bruises easily   . Tremors of nervous system     Assessment/Plan:   1 Day Post-Op Procedure(s) (LRB): OPEN REDUCTION INTERNAL FIXATION (ORIF) DISTAL FEMUR FRACTURE (Right) Active Problems:   Femur fracture   Acute post op blood loss anemia     Estimated body mass index is 31.32 kg/(m^2) as calculated from the following:   Height as  of this encounter:  (1.702 m).   Weight as of this encounter: 200 lb (90.719 kg). Advance diet Up with therapy  Needs BM Encouraged incentive spirometer Transfuse 1 unit PRBC    DVT Prophylaxis - Coumadin Partial weight-Bearing right leg D/C O2 and Pulse OX and try on Room Air  T. Cranston Neighbor, PA-C [facility] 01/23/2015, 8:05 AM

## 2015-01-23 NOTE — Evaluation (Signed)
Physical Therapy Evaluation Patient Details Name: Victoria Johnston MRN: [id number] DOB: 07-13-1923 Today's Date: 01/23/2015   History of Present Illness  Pt is an 81 y.o. female presenting to [facility] with R knee pain s/p fall and found to have acute periprosthetic distal R femur fx.  Pt s/p ORIF distal femur fx R 01/22/15 and is PWB'ing R LE.  Per PT's verbal discussion with MD Rosita Kea, pt does not need KI on operative extremity but can use if needed for quad weakness.  PMH:  Lumbar spinal stenosis, HLD, Crohn's disease, GERD, DVT on Coumadin, chronic systolic CHF, hearing loss.  Clinical Impression  Currently pt demonstrates impairments with strength, pain, and limitations with functional mobility.  Prior to admission, pt was independent without AD.  Pt lives with her daughter in 1 level home with 1 step to enter.  Currently pt requires significant assist to try to logroll in bed (pt attempting to assist but pt is also resisting making it difficult to assist pt; deferred OOB d/t significant assist required for bed mobility and pt appearing fatigued and receiving blood transfusion soon d/t 7.1 Hgb).  Pt would benefit from skilled PT to address above noted impairments and functional limitations.  Recommend pt discharge to STR when medically appropriate.     Follow Up Recommendations SNF    Equipment Recommendations  Rolling walker with 5" wheels    Recommendations for Other Services       Precautions / Restrictions Precautions Precautions: Fall;Knee Precaution Comments: No KI required but should use if pt has quad weakness; ok AAROM/AROM R knee ex's (per verbal discussion with MD Rosita Kea) Restrictions Weight Bearing Restrictions: Yes RLE Weight Bearing: Partial weight bearing LLE Weight Bearing: Weight bearing as tolerated      Mobility  Bed Mobility Overal bed mobility: Needs Assistance Bed Mobility: Rolling Rolling: Max assist         General bed mobility comments: Pt attempting to  assist with bed mobility but yet also resisting limiting ability to assist pt in moving; deferred OOB d/t significant assist required and pt appearing fatigued (pt's Hgb 7.1 and waiting for blood transfusion)  Transfers                    Ambulation/Gait                Stairs            Wheelchair Mobility    Modified Rankin (Stroke Patients Only)       Balance                                             Pertinent Vitals/Pain Pain Assessment: 0-10 Pain Score: 8  Pain Location: R hip/knee with movement of R LE Pain Descriptors / Indicators: Sharp;Shooting (with R LE movement) Pain Intervention(s): Limited activity within patient's tolerance;Monitored during session;Premedicated before session;Repositioned  See flowsheet for HR/O2 vitals.    Home Living Family/patient expects to be discharged to:: Skilled nursing facility Living Arrangements: Children (Pt's daughter) Available Help at Discharge: Family   Home Access: Stairs to enter Entrance Stairs-Rails: None Entrance Stairs-Number of Steps: 1 Home Layout: One level Home Equipment:  (pt reports having a walker at home that belongs to family but not sure what kind)      Prior Function Level of Independence: Independent  Hand Dominance        Extremity/Trunk Assessment   Upper Extremity Assessment: Generalized weakness           Lower Extremity Assessment: RLE deficits/detail;LLE deficits/detail RLE Deficits / Details: R hip flexion at least 2+/5, R knee flexion/extension at least 2+/5, R DF at least 3+/5 (all MMT limited d/t pain); R knee extension WFL; R knee flexion at least 30 degrees (limited d/t pain) LLE Deficits / Details: ROM and strength appearing WFL L LE     Communication   Communication: HOH  Cognition Arousal/Alertness: Awake/alert Behavior During Therapy: WFL for tasks assessed/performed Overall Cognitive Status: No family/caregiver  present to determine baseline cognitive functioning (Pt oriented to person, place, and reason for admission but did not know date)       Memory: Decreased recall of precautions              General Comments   Nursing cleared pt for participation in physical therapy and reports pt will be receiving blood transfusion today d/t Hgb 7.1.  Pt agreeable to PT session.     Exercises   Performed semi-supine B LE therapeutic exercise x 10 reps:  Ankle pumps (AROM B LE's); quad sets x3 second holds (AROM B LE's); glute squeezes x3 second holds (AROM B); SAQ's (AAROM R; AROM L); heelslides (AAROM R; AROM L), hip abd/adduction (AAROM R; AROM L).  Pt required vc's and tactile cues for correct technique with exercises.       Assessment/Plan    PT Assessment Patient needs continued PT services  PT Diagnosis Acute pain;Generalized weakness;Difficulty walking   PT Problem List Decreased strength;Decreased activity tolerance;Decreased balance;Decreased mobility;Decreased knowledge of use of DME;Decreased knowledge of precautions;Pain  PT Treatment Interventions DME instruction;Gait training;Stair training;Functional mobility training;Therapeutic activities;Therapeutic exercise;Balance training;Patient/family education   PT Goals (Current goals can be found in the Care Plan section) Acute Rehab PT Goals Patient Stated Goal: To move with less pain PT Goal Formulation: With patient Time For Goal Achievement: 02/06/15 Potential to Achieve Goals: Fair    Frequency BID   Barriers to discharge Decreased caregiver support      Co-evaluation               End of Session Equipment Utilized During Treatment: Oxygen Activity Tolerance: Patient limited by pain;Patient limited by fatigue Patient left: in bed;with call bell/phone within reach;with bed alarm set Nurse Communication: Mobility status;Precautions         Time: 1610-9604 PT Time Calculation (min) (ACUTE ONLY): 32  min   Charges:   PT Evaluation $Initial PT Evaluation Tier I: 1 Procedure PT Treatments $Therapeutic Exercise: 8-22 mins   PT G CodesHendricks Limes 01-26-2015, 11:05 AM Hendricks Limes, PT [phone]

## 2015-01-23 NOTE — Care Management Important Message (Signed)
Important Message  Patient Details  Name: Victoria Johnston MRN: [id number] Date of Birth: 1923/09/11   Medicare Important Message Given:  Yes-second notification given    Verita Schneiders Allmond 01/23/2015, 10:44 AM

## 2015-01-23 NOTE — Progress Notes (Signed)
Physical Therapy Treatment Patient Details Name: Victoria Johnston MRN: [id number] DOB: 26-May-1923 Today's Date: 01/23/2015    History of Present Illness Pt is an 81 y.o. female presenting to [facility] with R knee pain s/p fall and found to have acute periprosthetic distal R femur fx.  Pt s/p ORIF distal femur fx R 01/22/15 and is PWB'ing R LE.  Per PT's verbal discussion with MD Rosita Kea, pt does not need KI on operative extremity but can use if needed for quad weakness.  PMH:  Lumbar spinal stenosis, HLD, Crohn's disease, GERD, DVT on Coumadin, chronic systolic CHF, hearing loss.    PT Comments    Pt able to sit on edge of bed with 2 assist but unable to stand up to RW (pt unable to clear her bottom from bed to stand) with max assist x2.  R KI donned for all functional mobility during session.  Pt requiring increased time for functional mobility d/t pt appearing anxious with activity and pt's concerns for pain (although pt appears to be declining most pain medication).  Will continue to progress pt per pt tolerance.   Follow Up Recommendations  SNF     Equipment Recommendations  Rolling walker with 5" wheels    Recommendations for Other Services       Precautions / Restrictions Precautions Precautions: Fall;Knee Precaution Comments: No KI required but should use if pt has quad weakness; ok AAROM/AROM R knee ex's (per verbal PT discussion with MD Rosita Kea) Restrictions Weight Bearing Restrictions: Yes RLE Weight Bearing: Partial weight bearing LLE Weight Bearing: Weight bearing as tolerated    Mobility  Bed Mobility Overal bed mobility: Needs Assistance Bed Mobility: Supine to Sit;Sit to Supine     Supine to sit: Mod assist;Max assist;+2 for physical assistance Sit to supine: Mod assist;Max assist;+2 for physical assistance   General bed mobility comments: Pt requiring vc's for technique supine to/from sit  Transfers Overall transfer level: Needs assistance Equipment used: Rolling  walker (2 wheeled) Transfers: Sit to/from Stand Sit to Stand: Max assist;+2 physical assistance         General transfer comment: pt unable to clear her bottom from bed (to stand) with max assist x2  Ambulation/Gait             General Gait Details: not appropriate at this time   Stairs            Wheelchair Mobility    Modified Rankin (Stroke Patients Only)       Balance Overall balance assessment: Needs assistance Sitting-balance support: Bilateral upper extremity supported;Feet supported Sitting balance-Leahy Scale: Fair Sitting balance - Comments: Fair balance once pt set-up in sitting                            Cognition Arousal/Alertness: Awake/alert Behavior During Therapy: WFL for tasks assessed/performed Overall Cognitive Status: Within Functional Limits for tasks assessed       Memory: Decreased recall of precautions              Exercises      General Comments General comments (skin integrity, edema, etc.): R KI donned for all functional mobility during session  Nursing cleared pt for participation in physical therapy (blood transfusion finished).  Pt agreeable to PT session.  Pt's daughter present during session and educated on PT POC and pt's PT status.       Pertinent Vitals/Pain Pain Assessment: 0-10 Pain Score: 8  Pain Location:  R hip/knee  Pain Descriptors / Indicators: Sharp;Shooting (with R LE movement) Pain Intervention(s): Limited activity within patient's tolerance;Monitored during session;Repositioned  Vitals stable and WFL throughout treatment session (see flowsheet for details).    Home Living Family/patient expects to be discharged to:: Skilled nursing facility Living Arrangements:  (Daughter and grandson) Available Help at Discharge: Family   Home Access: Stairs to enter Entrance Stairs-Rails:  (1 step or 2 steps depending on entrance (one step has rail)) Home Layout: One level Home Equipment:  (has a  walker at home but did not use it)      Prior Function Level of Independence: Independent      Comments: did quit driving   PT Goals (current goals can now be found in the care plan section) Acute Rehab PT Goals Patient Stated Goal: have less pain PT Goal Formulation: With patient Time For Goal Achievement: 02/06/15 Potential to Achieve Goals: Fair Progress towards PT goals: Progressing toward goals    Frequency  BID    PT Plan Current plan remains appropriate    Co-evaluation             End of Session Equipment Utilized During Treatment: Gait belt Activity Tolerance: Patient limited by fatigue;Patient limited by pain Patient left: in bed;with call bell/phone within reach;with bed alarm set;with nursing/sitter in room;with family/visitor present;with SCD's reapplied     Time: 1308-6578 PT Time Calculation (min) (ACUTE ONLY): 43 min  Charges:  $Therapeutic Activity: 38-52 mins                    G CodesHendricks Limes February 06, 2015, 3:42 PM Hendricks Limes, PT [phone]

## 2015-01-23 NOTE — Care Management Note (Signed)
Case Management Note  Patient Details  Name: JARAH ROSS MRN: [id number] Date of Birth: Apr 18, 1924  Subjective/Objective:                Patient presents from home with fall resulting in femur fracture.  Patient lives at home with adopted daughter and grandson.  Patient uses [facility] in [place] to obtain her medication.  Patient has a Arts administrator, rolling walker, and cane from home.  Patient's sister Hazel Sams states that she is the health care POA.  Glenda express safety concerns about the patient being at her home during the day.  Patient's daughter is working during the day, and her grandson is asleep due to working third shift.   PT consult   Action/Plan:  RNCM to follow for discharge planning    Expected Discharge Date:                  Expected Discharge Plan:     In-House Referral:     Discharge planning Services     Post Acute Care Choice:    Choice offered to:     DME Arranged:    DME Agency:     HH Arranged:    HH Agency:     Status of Service:     Medicare Important Message Given:  Yes-second notification given Date Medicare IM Given:    Medicare IM give by:    Date Additional Medicare IM Given:    Additional Medicare Important Message give by:     If discussed at Long Length of Stay Meetings, dates discussed:    Additional Comments:  Chapman Fitch, RN 01/23/2015, 2:29 PM

## 2015-01-23 NOTE — Progress Notes (Signed)
ANTICOAGULATION CONSULT NOTE - Initial Consult  Pharmacy Consult for Warfarin Indication: VTE prophylaxis  No Known Allergies  Patient Measurements: Height: 5\' 7"  (170.2 cm) Weight: 200 lb (90.719 kg) IBW/kg (Calculated) : 61.6 Heparin Dosing Weight:   Vital Signs: Temp: 99.3 F (37.4 C) (09/26 0437) Temp Source: Oral (09/26 0437) BP: 103/44 mmHg (09/26 0437) Pulse Rate: 61 (09/26 0437)  Labs:  Recent Labs  01/20/15 1025  01/22/15 0349 01/22/15 0750 01/23/15 0405  HGB 10.2*  --   --   --  7.1*  HCT 30.7*  --   --   --  21.4*  PLT 107*  --   --   --  151  LABPROT 22.6*  < > 17.1* 16.5* 18.0*  INR 1.97  < > 1.37 1.31 1.47  CREATININE 1.19*  --  0.94  --  1.03*  < > = values in this interval not displayed.  Estimated Creatinine Clearance: 42.8 mL/min (by C-G formula based on Cr of 1.03).   Medical History: Past Medical History  Diagnosis Date  . Swelling   . Hearing loss   . Difficulty breathing   . Bruises easily   . Tremors of nervous system     Medications:  Prescriptions prior to admission  Medication Sig Dispense Refill Last Dose  . albuterol (PROVENTIL HFA;VENTOLIN HFA) 108 (90 BASE) MCG/ACT inhaler Inhale 2 puffs into the lungs every 6 (six) hours as needed for wheezing or shortness of breath.   PRN at PRN  . BIOTIN PO Take 1 capsule by mouth daily at 12 noon.   01/19/2015 at Unknown time  . folic acid (FOLVITE) 1 MG tablet Take 1 mg by mouth daily.   01/19/2015 at Unknown time  . haloperidol (HALDOL) 1 MG tablet Take 2 mg by mouth at bedtime.   01/19/2015 at Unknown time  . HYDROcodone-acetaminophen (NORCO/VICODIN) 5-325 MG per tablet Take 0.5 tablets by mouth at bedtime.   01/20/2015 at 0000  . Iron-Vitamin C (VITRON-C) 65-125 MG TABS Take 1 tablet by mouth daily.   01/20/2015 at Unknown time  . levothyroxine (SYNTHROID, LEVOTHROID) 75 MCG tablet Take 75 mcg by mouth daily before breakfast.   01/19/2015 at Unknown time  . LORazepam (ATIVAN) 1 MG tablet Take  1 mg by mouth 3 (three) times daily.    01/19/2015 at Unknown time  . magnesium oxide (MAG-OX) 400 MG tablet Take 400 mg by mouth daily.   01/19/2015 at Unknown time  . nitroGLYCERIN (NITROSTAT) 0.4 MG SL tablet Place 0.4 mg under the tongue every 5 (five) minutes as needed for chest pain.   PRN at PRN  . pantoprazole (PROTONIX) 40 MG tablet Take 40 mg by mouth 2 (two) times daily.   01/19/2015 at Unknown time  . potassium chloride SA (K-DUR,KLOR-CON) 20 MEQ tablet Take 20 mEq by mouth 2 (two) times daily.     . propranolol (INDERAL) 40 MG tablet Take 40 mg by mouth daily.   01/19/2015 at Unsure   . sulfaSALAzine (AZULFIDINE) 500 MG tablet Take 500-1,000 mg by mouth 3 (three) times daily. Pt takes two in the morning, two at noon, and one at night.   01/19/2015 at Unknown time  . topiramate (TOPAMAX) 100 MG tablet Take 50 mg by mouth 2 (two) times daily.   01/19/2015 at Unknown time  . torsemide (DEMADEX) 20 MG tablet Take 30 mg by mouth daily.     . traMADol (ULTRAM) 50 MG tablet Take 25 mg by mouth daily.  01/19/2015 at Unsure   . vitamin B-12 (CYANOCOBALAMIN) 1000 MCG tablet Take 1,000 mcg by mouth daily at 12 noon.   01/19/2015 at Unknown time  . warfarin (COUMADIN) 5 MG tablet Take 5 mg by mouth at bedtime.   01/19/2015 at 0000    Assessment: Pharmacy consulted to dose warfarin in this 82 year old female S/P surgical repair of femur fracture.   MD wants to resume warfarin for DVT prophylaxis.  Goal of Therapy:  INR 2-3  Plan:  Will restart Warfarin 5 mg PO daily on 9/25 and recheck INR on 9/26 with AM labs.  Will monitor H&H daily.   9/26 INR 1.47. Continue current regimen. Recheck INR in AM.  McBane,Matthew S 01/23/2015,5:28 AM

## 2015-01-23 NOTE — Evaluation (Signed)
Occupational Therapy Evaluation Patient Details Name: Victoria Johnston MRN: [id number] DOB: 06-04-1923 Today's Date: 01/23/2015    History of Present Illness This patient is an 81 year old female who came to [facility] with R knee pain after a fall and found to have acute periprosthetic distal R femur fracture.  She received an open reduction with internal fixation  distal femur fx R 01/22/15 and is partial weight bearing  R LE.  The Physical Therapy discussion with MD Rudene Christians, pt does not need knee immobilizer   on operative extremity but can use if needed for quad weakness.  PMH:  Lumbar spinal stenosis, HLD, Crohn's disease, GERD, DVT on Coumadin, chronic systolic CHF, hearing loss.   Clinical Impression   Patient is an 81 year old female who came to [facility]  with the above problems. She lives in a one story home with her daughter and grandson and had been independent with activities of daily living and mobility with out use of assistive device. She gave up driving. She now needs significant help with activities of daily living and mobility and would benefit from Occupational Therapy for ADL/functioal mobility training while maintaining partial weight bearing.     Follow Up Recommendations  SNF    Equipment Recommendations       Recommendations for Other Services       Precautions / Restrictions Precautions Precautions: Fall;Knee Precaution Comments: No KI required but should use if pt has quad weakness; ok AAROM/AROM R knee ex's (per verbal physical therapist who discussed with MD Rudene Christians) Restrictions Weight Bearing Restrictions: Yes RLE Weight Bearing: Partial weight bearing LLE Weight Bearing: Weight bearing as tolerated      Mobility Bed Mobility          Transfers                      Balance                                            ADL                                          General ADL Comments: Patient had been independent with her basic ADL and mobility. Patient unable to get out of bed with Physical Therapy this morning. Instructed and illustrated with patient in the use of hip kit to complete lower body dressing.     Vision     Perception     Praxis      Pertinent Vitals/Pain Pain Assessment: 0-10 Pain Score:  (Patient calmly said 10) Pain Location: R hip/knee with movement of R LE Pain Descriptors / Indicators: Sharp;Shooting (with R LE movement) Pain Intervention(s): Limited activity within patient's tolerance;Monitored during session;Premedicated before session;Repositioned     Hand Dominance     Extremity/Trunk Assessment Upper Extremity Assessment Upper Extremity Assessment:  (Active shoulder flexion R 20o L 40o B elbow extension3+/5 flexion 4-?5)         Communication Communication Communication: HOH   Cognition Arousal/Alertness: Awake/alert Behavior During Therapy: WFL for tasks assessed/performed Overall Cognitive Status: No family/caregiver present to determine baseline cognitive functioning (Pt oriented to person, place, and reason for admission but did not know date)  Memory: Decreased recall of precautions             General Comments       Exercises       Shoulder Instructions      Home Living Family/patient expects to be discharged to:: Skilled nursing facility Living Arrangements:  (Daughter and grandson) Available Help at Discharge: Family   Home Access: Stairs to enter Technical brewer of Steps: 1 Entrance Stairs-Rails:  (1 step or 2 steps depending on entrance (one step has rail)) Home Layout: One level               Home Equipment:  (has a walker at home but did not use it)          Prior Functioning/Environment Level of Independence: Independent        Comments: did quit driving    OT Diagnosis: Generalized weakness;Acute pain (tremors)   OT Problem List: Decreased  strength;Decreased range of motion;Decreased activity tolerance;Impaired balance (sitting and/or standing);Decreased coordination;Decreased knowledge of precautions;Decreased knowledge of use of DME or AE   OT Treatment/Interventions: Self-care/ADL training    OT Goals(Current goals can be found in the care plan section) Acute Rehab OT Goals Patient Stated Goal: have less pain OT Goal Formulation: With patient Time For Goal Achievement: 01/23/15 Potential to Achieve Goals: Good  OT Frequency: Min 1X/week   Barriers to D/C:            Co-evaluation              End of Session Equipment Utilized During Treatment:  (hip kit)  Activity Tolerance:   Patient left: in bed;with call bell/phone within reach;with bed alarm set   Time: 8563-1497 OT Time Calculation (min): 24 min Charges:  OT General Charges $OT Visit: 1 Procedure OT Evaluation $Initial OT Evaluation Tier I: 1 Procedure OT Treatments $Self Care/Home Management : 8-22 mins G-Codes:    Myrene Galas, MS/OTR/L  01/23/2015, 11:58 AM

## 2015-01-23 NOTE — Progress Notes (Signed)
Victoria Johnston is a 81 y.o. female   SUBJECTIVE:  Patient postop hip fracture, hemoglobin down to 7.1. No complaints  ______________________________________________________________________  ROS: Review of systems is unremarkable for any active cardiac,respiratory, GI, GU, hematologic, neurologic or psychiatric systems, 10 systems reviewed.  . sodium chloride   Intravenous Once  . docusate sodium  100 mg Oral BID  . folic acid  1 mg Oral Daily  . haloperidol  1 mg Oral QHS  . levothyroxine  75 mcg Oral QAC breakfast  . magnesium oxide  400 mg Oral Daily  . pantoprazole (PROTONIX) IV  40 mg Intravenous Q12H  . propranolol  40 mg Oral BID  . senna  1 tablet Oral BID  . sodium chloride  10 mL Intravenous Q12H  . sulfaSALAzine  500 mg Oral BID  . topiramate  50 mg Oral BID  . warfarin  5 mg Oral QHS  . Warfarin - Pharmacist Dosing Inpatient   Does not apply q1800   acetaminophen **OR** acetaminophen, albuterol, bisacodyl, LORazepam, menthol-cetylpyridinium **OR** phenol, metoCLOPramide **OR** metoCLOPramide (REGLAN) injection, morphine injection, nitroGLYCERIN, ondansetron **OR** ondansetron (ZOFRAN) IV, oxyCODONE, polyethylene glycol   Past Medical History  Diagnosis Date  . Swelling   . Hearing loss   . Difficulty breathing   . Bruises easily   . Tremors of nervous system     Past Surgical History  Procedure Laterality Date  . Abdominal hysterectomy    . Knee surgery Bilateral   . Foot surgery Bilateral     PHYSICAL EXAM:  BP 103/44 mmHg  Pulse 61  Temp(Src) 99.3 F (37.4 C) (Oral)  Resp 20  Ht 5\' 7"  (1.702 m)  Wt 90.719 kg (200 lb)  BMI 31.32 kg/m2  SpO2 98%  Wt Readings from Last 3 Encounters:  01/20/15 90.719 kg (200 lb)  06/30/13 92.534 kg (204 lb)  03/03/13 99.791 kg (220 lb)           BP Readings from Last 3 Encounters:  01/23/15 103/44  06/30/13 105/58  03/03/13 109/55    Constitutional: NAD Neck: supple, no thyromegaly Respiratory: CTA, no  rales or wheezes Cardiovascular: RRR, no murmur, no gallop Abdomen: soft, good BS, nontender Extremities: no edema Neuro: alert and oriented, no focal motor or sensory deficits  ASSESSMENT/PLAN:  Labs and imaging studies were reviewed  Acute blood loss anemia-hemoglobin 7.1, transfuse 1 unit of PRBC, postop, IV Protonix Postop hip-orthopedic treatment 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

## 2015-01-23 NOTE — Progress Notes (Signed)
Patient cooperative with care.  PT was only able to get patient up to sitting on side of bed.  Patient complained of nausea during this and all day had declined any pain medication.  Tomorrow prior to PT this patient needs to pre medicate for PT so that she can contribute more during PT.

## 2015-01-23 NOTE — Clinical Social Work Placement (Signed)
   CLINICAL SOCIAL WORK PLACEMENT  NOTE  Date:  01/23/2015  Patient Details  Name: Victoria Johnston MRN: [id number] Date of Birth: January 09, 1924  Clinical Social Work is seeking post-discharge placement for this patient at the Skilled  Nursing Facility level of care (*CSW will initial, date and re-position this form in  chart as items are completed):  Yes   Patient/family provided with [facility] Clinical Social Work Department's list of facilities offering this level of care within the geographic area requested by the patient (or if unable, by the patient's family).  Yes   Patient/family informed of their freedom to choose among providers that offer the needed level of care, that participate in Medicare, Medicaid or managed care program needed by the patient, have an available bed and are willing to accept the patient.  Yes   Patient/family informed of [facility]'s ownership interest in [facility] and [facility], as well as of the fact that they are under no obligation to receive care at these facilities.  PASRR submitted to EDS on 01/21/15     PASRR number received on 01/21/15     Existing PASRR number confirmed on       FL2 transmitted to all facilities in geographic area requested by pt/family on 01/23/15     FL2 transmitted to all facilities within larger geographic area on       Patient informed that his/her managed care company has contracts with or will negotiate with certain facilities, including the following:        Yes   Patient/family informed of bed offers received.  Patient chooses bed at  [facility] )     Physician recommends and patient chooses bed at      Patient to be transferred to   on  .  Patient to be transferred to facility by       Patient family notified on   of transfer.  Name of family member notified:        PHYSICIAN Please sign FL2     Additional Comment:    _______________________________________________ Haig Prophet, LCSW 01/23/2015, 4:46 PM

## 2015-01-24 LAB — TYPE AND SCREEN
ABO/RH(D): A POS
Antibody Screen: NEGATIVE
UNIT DIVISION: 0

## 2015-01-24 LAB — CBC WITH DIFFERENTIAL/PLATELET
BASOS ABS: 0 10*3/uL (ref 0–0.1)
BASOS PCT: 1 %
EOS ABS: 0 10*3/uL (ref 0–0.7)
EOS PCT: 1 %
HEMATOCRIT: 21.8 % — AB (ref 35.0–47.0)
Hemoglobin: 7.3 g/dL — ABNORMAL LOW (ref 12.0–16.0)
Lymphocytes Relative: 24 %
Lymphs Abs: 1.7 10*3/uL (ref 1.0–3.6)
MCH: 31.9 pg (ref 26.0–34.0)
MCHC: 33.6 g/dL (ref 32.0–36.0)
MCV: 94.9 fL (ref 80.0–100.0)
MONO ABS: 0.8 10*3/uL (ref 0.2–0.9)
MONOS PCT: 11 %
Neutro Abs: 4.5 10*3/uL (ref 1.4–6.5)
Neutrophils Relative %: 63 %
PLATELETS: 180 10*3/uL (ref 150–440)
RBC: 2.3 MIL/uL — ABNORMAL LOW (ref 3.80–5.20)
RDW: 15.5 % — AB (ref 11.5–14.5)
WBC: 7.1 10*3/uL (ref 3.6–11.0)

## 2015-01-24 LAB — BASIC METABOLIC PANEL
Anion gap: 3 — ABNORMAL LOW (ref 5–15)
BUN: 14 mg/dL (ref 6–20)
CALCIUM: 7.1 mg/dL — AB (ref 8.9–10.3)
CHLORIDE: 113 mmol/L — AB (ref 101–111)
CO2: 22 mmol/L (ref 22–32)
CREATININE: 1.14 mg/dL — AB (ref 0.44–1.00)
GFR, EST AFRICAN AMERICAN: 48 mL/min — AB (ref >60)
GFR, EST NON AFRICAN AMERICAN: 41 mL/min — AB (ref >60)
Glucose, Bld: 107 mg/dL — ABNORMAL HIGH (ref 65–99)
Potassium: 3.4 mmol/L — ABNORMAL LOW (ref 3.5–5.1)
SODIUM: 138 mmol/L (ref 135–145)

## 2015-01-24 LAB — PROTIME-INR
INR: 1.55
PROTHROMBIN TIME: 18.8 s — AB (ref 11.4–15.0)

## 2015-01-24 MED ORDER — LORAZEPAM 1 MG PO TABS: 1.0000 mg | ORAL_TABLET | Freq: Three times a day (TID) | ORAL | Status: DC | PRN

## 2015-01-24 MED ORDER — ACETAMINOPHEN 325 MG PO TABS: 650.0000 mg | ORAL_TABLET | Freq: Four times a day (QID) | ORAL | Status: DC | PRN

## 2015-01-24 MED ORDER — PANTOPRAZOLE SODIUM 40 MG PO TBEC
40.0000 mg | DELAYED_RELEASE_TABLET | Freq: Two times a day (BID) | ORAL | Status: DC
  Administered 2015-01-24 – 2015-01-25 (×3): 40 mg via ORAL
  Filled 2015-01-24 (×3): qty 1

## 2015-01-24 MED ORDER — OXYCODONE HCL 5 MG PO TABS: 5.0000 mg | ORAL_TABLET | ORAL | Status: DC | PRN

## 2015-01-24 MED ORDER — SENNA 8.6 MG PO TABS: 1.0000 | ORAL_TABLET | Freq: Two times a day (BID) | ORAL | Status: DC

## 2015-01-24 MED ORDER — DEXTROSE 5 % IV SOLN
1.0000 g | INTRAVENOUS | Status: DC
  Administered 2015-01-24 – 2015-01-25 (×2): 1 g via INTRAVENOUS
  Filled 2015-01-24 (×3): qty 10

## 2015-01-24 MED ORDER — GABAPENTIN 300 MG PO CAPS
300.0000 mg | ORAL_CAPSULE | Freq: Once | ORAL | Status: AC
  Administered 2015-01-24: 300 mg via ORAL
  Filled 2015-01-24: qty 1

## 2015-01-24 MED ORDER — DOCUSATE SODIUM 100 MG PO CAPS: 100.0000 mg | ORAL_CAPSULE | Freq: Two times a day (BID) | ORAL | Status: DC

## 2015-01-24 MED ORDER — BISACODYL 10 MG RE SUPP: 10.0000 mg | Freq: Every day | RECTAL | Status: DC | PRN

## 2015-01-24 MED ORDER — HALOPERIDOL 1 MG PO TABS: 1.0000 mg | ORAL_TABLET | Freq: Every day | ORAL | Status: DC

## 2015-01-24 MED ORDER — MINOCYCLINE HCL 100 MG PO CAPS: 100.0000 mg | ORAL_CAPSULE | Freq: Two times a day (BID) | ORAL | Status: DC

## 2015-01-24 NOTE — Progress Notes (Signed)
Patient ID: Victoria Johnston, female   DOB: 1924-02-03, 81 y.o.   MRN: [id number] Victoria Johnston is a 81 y.o. female   SUBJECTIVE:  Patient postop hip fracture, hemoglobin down to 7.1, hemoglobin pending this morning, no complaints, sleeping.  ______________________________________________________________________  ROS: Review of systems is unremarkable for any active cardiac,respiratory, GI, GU, hematologic, neurologic or psychiatric systems, 10 systems reviewed.  . docusate sodium  100 mg Oral BID  . folic acid  1 mg Oral Daily  . haloperidol  1 mg Oral QHS  . Influenza vac split quadrivalent PF  0.5 mL Intramuscular Tomorrow-1000  . levothyroxine  75 mcg Oral QAC breakfast  . magnesium oxide  400 mg Oral Daily  . pantoprazole (PROTONIX) IV  40 mg Intravenous Q12H  . pneumococcal 23 valent vaccine  0.5 mL Intramuscular Tomorrow-1000  . propranolol  40 mg Oral BID  . senna  1 tablet Oral BID  . sodium chloride  10 mL Intravenous Q12H  . sulfaSALAzine  500 mg Oral BID  . topiramate  50 mg Oral BID  . warfarin  5 mg Oral QHS  . Warfarin - Pharmacist Dosing Inpatient   Does not apply q1800   acetaminophen **OR** acetaminophen, albuterol, bisacodyl, LORazepam, menthol-cetylpyridinium **OR** phenol, metoCLOPramide **OR** metoCLOPramide (REGLAN) injection, morphine injection, nitroGLYCERIN, ondansetron **OR** ondansetron (ZOFRAN) IV, oxyCODONE, polyethylene glycol   Past Medical History  Diagnosis Date  . Swelling   . Hearing loss   . Difficulty breathing   . Bruises easily   . Tremors of nervous system     Past Surgical History  Procedure Laterality Date  . Abdominal hysterectomy    . Knee surgery Bilateral   . Foot surgery Bilateral   . Orif femur fracture Right 01/22/2015    Procedure: OPEN REDUCTION INTERNAL FIXATION (ORIF) DISTAL FEMUR FRACTURE;  Surgeon: Kennedy Bucker, MD;  Location: [facility] ORS;  Service: Orthopedics;  Laterality: Right;    PHYSICAL EXAM:  BP 120/43  mmHg  Pulse 77  Temp(Src) 99.6 F (37.6 C) (Oral)  Resp 20  Ht 5\' 7"  (1.702 m)  Wt 90.719 kg (200 lb)  BMI 31.32 kg/m2  SpO2 96%  Wt Readings from Last 3 Encounters:  01/20/15 90.719 kg (200 lb)  06/30/13 92.534 kg (204 lb)  03/03/13 99.791 kg (220 lb)           BP Readings from Last 3 Encounters:  01/24/15 120/43  06/30/13 105/58  03/03/13 109/55    Constitutional: NAD Neck: supple, no thyromegaly Respiratory: CTA, no rales or wheezes Cardiovascular: RRR, no murmur, no gallop Abdomen: soft, good BS, nontender Extremities: no edema Neuro: Sleeping  ASSESSMENT/PLAN:  Labs and imaging studies were reviewed  Acute blood loss anemia-hemoglobin 7.1, post 1 unit of PRBC, postop, IV Protonix Postop hip-orthopedic treatment 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

## 2015-01-24 NOTE — Progress Notes (Signed)
Victoria Johnston complaining of her feet "freezing." Nursing applied extra covers and massaged feet; feet warm to touch. Victoria Johnston states this is a chronic issue that keeps her awake at night, and also states that her feet feeling cold, numb and tingling was the cause for her fall. States she has tried to tell her family about it and "no one believes her, they tell her that her feet are warm." Dr Dan Humphreys on-call notified, one-time order for gabapentin ordered and for Dr Hyacinth Meeker to follow-up tomorrow. Nursing continues to monitor.

## 2015-01-24 NOTE — Progress Notes (Signed)
   Subjective: 2 Days Post-Op Procedure(s) (LRB): OPEN REDUCTION INTERNAL FIXATION (ORIF) DISTAL FEMUR FRACTURE (Right) Patient reports pain as mild.   Patient is well, and has had no acute complaints or problems We will start therapy today.  Plan is to go Rehab after hospital stay.  Objective: Vital signs in last 24 hours: Temp:  [98.3 F (36.8 C)-100.1 F (37.8 C)] 100.1 F (37.8 C) (09/27 0750) Pulse Rate:  [51-78] 78 (09/27 0750) Resp:  [18-20] 18 (09/27 0750) BP: (90-135)/(33-63) 135/55 mmHg (09/27 0750) SpO2:  [94 %-99 %] 98 % (09/27 0750)  Intake/Output from previous day: 09/26 0701 - 09/27 0700 In: 2390 [P.O.:600; I.V.:1500; Blood:290] Out: 0  Intake/Output this shift:     Recent Labs  01/23/15 0405 01/24/15 0654  HGB 7.1* 7.3*    Recent Labs  01/23/15 0405 01/24/15 0654  WBC 7.6 7.1  RBC 2.20* 2.30*  HCT 21.4* 21.8*  PLT 151 180    Recent Labs  01/23/15 0405 01/24/15 0654  NA 137 138  K 3.8 3.4*  CL 110 113*  CO2 22 22  BUN 14 14  CREATININE 1.03* 1.14*  GLUCOSE 95 107*  CALCIUM 7.4* 7.1*    Recent Labs  01/23/15 0405 01/24/15 0654  INR 1.47 1.55    EXAM General - Patient is Alert, Appropriate and Oriented Extremity - Neurovascular intact Sensation intact distally Intact pulses distally Dorsiflexion/Plantar flexion intact Dressing - dressing C/D/I and scant drainage, new dressing applied. Knee immobilizer intact. Motor Function - intact, moving foot and toes well on exam.   Past Medical History  Diagnosis Date  . Swelling   . Hearing loss   . Difficulty breathing   . Bruises easily   . Tremors of nervous system     Assessment/Plan:   2 Days Post-Op Procedure(s) (LRB): OPEN REDUCTION INTERNAL FIXATION (ORIF) DISTAL FEMUR FRACTURE (Right) Active Problems:   Femur fracture   Acute post op blood loss anemia   Estimated body mass index is 31.32 kg/(m^2) as calculated from the following:   Height as of this encounter: 5'  7" (1.702 m).   Weight as of this encounter: 90.719 kg (200 lb). Advance diet Up with therapy Plan for discharge tomorrow  Needs BM  DVT Prophylaxis - Coumadin Partial weight-Bearing Right lower extremity D/C O2 and Pulse OX and try on Room Air  T. Cranston Neighbor, PA-C [facility] 01/24/2015, 9:54 AM

## 2015-01-24 NOTE — Progress Notes (Signed)
Patient cooperative with care.  Denies any pain unless moving with physical therapy.  Was able to get patient to chair from bed but afternoon physical therapy had a harder time returning patient to bed.  Plan to discharge to [facility].  Needs bowel movement.

## 2015-01-24 NOTE — Discharge Instructions (Signed)
Diet: As you were doing prior to hospitalization   Shower:  May shower but keep the wounds dry, use an occlusive plastic wrap, NO SOAKING IN TUB.  If the bandage gets wet, change with a clean dry gauze.  Dressing:  You may change your dressing as needed. Change the dressing with sterile gauze dressing.    Activity:  Increase activity slowly as tolerated, but follow the weight bearing instructions below.  No lifting or driving for 6 weeks.  Weight Bearing:   Partial weight bearing rightt lower extremity  To prevent constipation: you may use a stool softener such as -  Colace (over the counter) 100 mg by mouth twice a day  Drink plenty of fluids (prune juice may be helpful) and high fiber foods Miralax (over the counter) for constipation as needed.    Itching:  If you experience itching with your medications, try taking only a single pain pill, or even half a pain pill at a time.  You may take up to 10 pain pills per day, and you can also use benadryl over the counter for itching or also to help with sleep.   Precautions:  If you experience chest pain or shortness of breath - call 911 immediately for transfer to the hospital emergency department!!  If you develop a fever greater that 101 F, purulent drainage from wound, increased redness or drainage from wound, or calf pain-Call [facility]                                              Follow- Up Appointment:  Please call for an appointment to be seen in 2 weeks at [facility]

## 2015-01-24 NOTE — Progress Notes (Signed)
ANTICOAGULATION CONSULT NOTE - FOLLOW UP   Pharmacy Consult for Warfarin Indication: VTE prophylaxis  No Known Allergies  Patient Measurements: Height:  (170.2 cm) Weight: 200 lb (90.719 kg) IBW/kg (Calculated) : 61.6 Heparin Dosing Weight:   Vital Signs: Temp: 100.1 F (37.8 C) (09/27 0750) Temp Source: Oral (09/27 0750) BP: 135/55 mmHg (09/27 0750) Pulse Rate: 78 (09/27 0750)  Labs:  Recent Labs  01/22/15 0349 01/22/15 0750 01/23/15 0405 01/24/15 0654  HGB  --   --  7.1* 7.3*  HCT  --   --  21.4* 21.8*  PLT  --   --  151 180  LABPROT 17.1* 16.5* 18.0* 18.8*  INR 1.37 1.31 1.47 1.55  CREATININE 0.94  --  1.03* 1.14*    Estimated Creatinine Clearance: 38.7 mL/min (by C-G formula based on Cr of 1.14).   Medical History: Past Medical History  Diagnosis Date  . Swelling   . Hearing loss   . Difficulty breathing   . Bruises easily   . Tremors of nervous system     Medications:  Prescriptions prior to admission  Medication Sig Dispense Refill Last Dose  . albuterol (PROVENTIL HFA;VENTOLIN HFA) 108 (90 BASE) MCG/ACT inhaler Inhale 2 puffs into the lungs every 6 (six) hours as needed for wheezing or shortness of breath.   PRN at PRN  . BIOTIN PO Take 1 capsule by mouth daily at 12 noon.   01/19/2015 at Unknown time  . folic acid (FOLVITE) 1 MG tablet Take 1 mg by mouth daily.   01/19/2015 at Unknown time  . haloperidol (HALDOL) 1 MG tablet Take 2 mg by mouth at bedtime.   01/19/2015 at Unknown time  . HYDROcodone-acetaminophen (NORCO/VICODIN) 5-325 MG per tablet Take 0.5 tablets by mouth at bedtime.   01/20/2015 at 0000  . Iron-Vitamin C (VITRON-C) 65-125 MG TABS Take 1 tablet by mouth daily.   01/20/2015 at Unknown time  . levothyroxine (SYNTHROID, LEVOTHROID) 75 MCG tablet Take 75 mcg by mouth daily before breakfast.   01/19/2015 at Unknown time  . LORazepam (ATIVAN) 1 MG tablet Take 1 mg by mouth 3 (three) times daily.    01/19/2015 at Unknown time  . magnesium  oxide (MAG-OX) 400 MG tablet Take 400 mg by mouth daily.   01/19/2015 at Unknown time  . nitroGLYCERIN (NITROSTAT) 0.4 MG SL tablet Place 0.4 mg under the tongue every 5 (five) minutes as needed for chest pain.   PRN at PRN  . pantoprazole (PROTONIX) 40 MG tablet Take 40 mg by mouth 2 (two) times daily.   01/19/2015 at Unknown time  . potassium chloride SA (K-DUR,KLOR-CON) 20 MEQ tablet Take 20 mEq by mouth 2 (two) times daily.     . propranolol (INDERAL) 40 MG tablet Take 40 mg by mouth daily.   01/19/2015 at Unsure   . sulfaSALAzine (AZULFIDINE) 500 MG tablet Take 500-1,000 mg by mouth 3 (three) times daily. Pt takes two in the morning, two at noon, and one at night.   01/19/2015 at Unknown time  . topiramate (TOPAMAX) 100 MG tablet Take 50 mg by mouth 2 (two) times daily.   01/19/2015 at Unknown time  . torsemide (DEMADEX) 20 MG tablet Take 30 mg by mouth daily.     . traMADol (ULTRAM) 50 MG tablet Take 25 mg by mouth daily.   01/19/2015 at Unsure   . vitamin B-12 (CYANOCOBALAMIN) 1000 MCG tablet Take 1,000 mcg by mouth daily at 12 noon.   01/19/2015 at Unknown  time  . warfarin (COUMADIN) 5 MG tablet Take 5 mg by mouth at bedtime.   01/19/2015 at 0000    Assessment: Pharmacy consulted to dose warfarin in this 81 year old female S/P surgical repair of femur fracture.   MD wants to resume warfarin for DVT prophylaxis.  Goal of Therapy:  INR 2-3  Plan:  INR steady trending upward. Will continue warfarin 5 mg PO q1800.   Demetrius Charity, PharmD

## 2015-01-24 NOTE — Progress Notes (Signed)
Physical Therapy Treatment Patient Details Name: Victoria Johnston MRN: [id number] DOB: 01/20/1924 Today's Date: 01/24/2015    History of Present Illness Pt is an 81 y.o. female presenting to [facility] with R knee pain s/p fall and found to have acute periprosthetic distal R femur fx.  Pt s/p ORIF distal femur fx R 01/22/15 and is PWB'ing R LE.  Per PT's verbal discussion with MD Rosita Kea, pt does not need KI on operative extremity but can use if needed for quad weakness.  PMH:  Lumbar spinal stenosis, HLD, Crohn's disease, GERD, DVT on Coumadin, chronic systolic CHF, hearing loss.    PT Comments    Pt appearing fatigued sitting in chair this afternoon (was sliding down in chair) and required significant assist to get back to bed (recommend nursing use hoyer lift for any OOB mobility).  Pt appearing motivated to participate in PT but demonstrates impaired activity tolerance.  Continue to recommend pt discharge to STR when medically appropriate.   Follow Up Recommendations  SNF     Equipment Recommendations  Rolling walker with 5" wheels    Recommendations for Other Services       Precautions / Restrictions Precautions Precautions: Fall;Knee Precaution Comments: No KI required but should use if pt has quad weakness; ok AAROM/AROM R knee ex's (per verbal PT discussion with MD Rosita Kea) Restrictions Weight Bearing Restrictions: Yes RLE Weight Bearing: Partial weight bearing LLE Weight Bearing: Weight bearing as tolerated    Mobility  Bed Mobility Overal bed mobility: Needs Assistance Bed Mobility: Sit to Supine       Sit to supine: Max assist;+2 for physical assistance   General bed mobility comments: Vc's required for technique; assist for trunk and B LE's required  Transfers Overall transfer level: Needs assistance Equipment used: Rolling walker (2 wheeled) Transfers: Sit to/from [employer] Sit to Stand: Max assist;+2 physical assistance (pt unable to stand with 2  assist from recliner chair)   Squat pivot transfers:  (3 assist squat pivot recliner to bed)     General transfer comment: pt appearing fatigued this afternoon requiring increased assist for transfer back to bed  Ambulation/Gait             General Gait Details: not appropriate at this time   Stairs            Wheelchair Mobility    Modified Rankin (Stroke Patients Only)       Balance Overall balance assessment: Needs assistance Sitting-balance support: Bilateral upper extremity supported;Feet supported Sitting balance-Leahy Scale: Fair                              Cognition Arousal/Alertness: Awake/alert Behavior During Therapy: WFL for tasks assessed/performed Overall Cognitive Status: Within Functional Limits for tasks assessed       Memory: Decreased recall of precautions              Exercises      General Comments  Pt agreeable to PT session.  Pt's daughter present for part of session.      Pertinent Vitals/Pain Pain Assessment: 0-10 Pain Score: 8  (8/10 with mobility; 0/10 at rest beginning and end of session) Pain Location: R hip/knee Pain Descriptors / Indicators: Sharp;Shooting (with R LE movement) Pain Intervention(s): Limited activity within patient's tolerance;Monitored during session;Repositioned  Vitals stable and WFL throughout treatment session.    Home Living  Prior Function            PT Goals (current goals can now be found in the care plan section) Acute Rehab PT Goals Patient Stated Goal: have less pain PT Goal Formulation: With patient Time For Goal Achievement: 02/06/15 Potential to Achieve Goals: Fair Progress towards PT goals: Progressing toward goals    Frequency  BID    PT Plan Current plan remains appropriate    Co-evaluation             End of Session Equipment Utilized During Treatment: Gait belt Activity Tolerance: Patient limited by pain;Patient  limited by fatigue Patient left: in bed;with call bell/phone within reach;with bed alarm set;with family/visitor present;with SCD's reapplied     Time: 1335-1400 PT Time Calculation (min) (ACUTE ONLY): 25 min  Charges:  $Therapeutic Activity: 23-37 mins                    G CodesHendricks Limes 15-Feb-2015, 4:32 PM Hendricks Limes, PT [phone]

## 2015-01-24 NOTE — Progress Notes (Signed)
Physical Therapy Treatment Patient Details Name: Victoria Johnston MRN: [id number] DOB: Jul 15, 1923 Today's Date: 01/24/2015    History of Present Illness Pt is an 81 y.o. female presenting to [facility] with R knee pain s/p fall and found to have acute periprosthetic distal R femur fx.  Pt s/p ORIF distal femur fx R 01/22/15 and is PWB'ing R LE.  Per PT's verbal discussion with MD Rosita Kea, pt does not need KI on operative extremity but can use if needed for quad weakness.  PMH:  Lumbar spinal stenosis, HLD, Crohn's disease, GERD, DVT on Coumadin, chronic systolic CHF, hearing loss.    PT Comments    Pt requiring mod to max assist x2 to stand up to RW but once pt was standing, pt was min to mod assist x2 to pivot on her L LE with use of RW bed to recliner (pt able to pick up R LE on her own to move it as she pivoted on her L LE).  Pt reporting no pain once she was sitting up in recliner end of session.  R KI donned for sessions activities d/t R quad weakness.  Pt appearing motivated to participate in PT and will continue to progress her per pt tolerance.   Follow Up Recommendations  SNF     Equipment Recommendations  Rolling walker with 5" wheels    Recommendations for Other Services       Precautions / Restrictions Precautions Precautions: Fall;Knee Precaution Comments: No KI required but should use if pt has quad weakness; ok AAROM/AROM R knee ex's (per verbal PT discussion with MD Rosita Kea) Restrictions Weight Bearing Restrictions: Yes RLE Weight Bearing: Partial weight bearing LLE Weight Bearing: Weight bearing as tolerated    Mobility  Bed Mobility Overal bed mobility: Needs Assistance Bed Mobility: Supine to Sit     Supine to sit: Max assist;HOB elevated     General bed mobility comments: Vc's required for technique; assist for trunk and R LE required  Transfers Overall transfer level: Needs assistance Equipment used: Rolling walker (2 wheeled) Transfers: Sit to/from [employer] Sit to Stand: Mod assist;Max assist;+2 physical assistance Stand pivot transfers: Min assist;Mod assist       General transfer comment: x3 trials to stand (pt 1/2 stand 1st trial; unable to clear pt's bottom from bed 2nd trial; able to come to full stand 3rd trial with L knee blocked); vc's required for hand and feet placement and PWB'ing status to stand.  Once upright with RW, pt min assist x2 for balance.  Pt able to pick up her R LE on her own to move and with assist pivoted on her L LE to transfer bed to recliner with RW.  Pt required assist for R LE to sit in recliner.  Ambulation/Gait             General Gait Details: not appropriate at this time   Stairs            Wheelchair Mobility    Modified Rankin (Stroke Patients Only)       Balance Overall balance assessment: Needs assistance Sitting-balance support: Bilateral upper extremity supported;Feet supported Sitting balance-Leahy Scale: Fair Sitting balance - Comments: Fair balance once pt set-up in sitting   Standing balance support: Bilateral upper extremity supported (on RW) Standing balance-Leahy Scale: Fair                      Cognition Arousal/Alertness: Awake/alert Behavior During Therapy: WFL for tasks  assessed/performed Overall Cognitive Status: Within Functional Limits for tasks assessed       Memory: Decreased recall of precautions              Exercises      General Comments General comments (skin integrity, edema, etc.): R KI donned for all functional mobility during session  Nursing cleared pt for participation in physical therapy.  Pt agreeable to PT session.       Pertinent Vitals/Pain Pain Assessment: 0-10 Pain Score: 8  (8/10 with mobility; 0/10 at rest beginning and end of session) Pain Location: R hip/knee  Pain Descriptors / Indicators: Sharp;Shooting (with R LE movement) Pain Intervention(s): Limited activity within patient's  tolerance;Monitored during session;Premedicated before session;Repositioned  Vitals stable and WFL throughout treatment session.    Home Living                      Prior Function            PT Goals (current goals can now be found in the care plan section) Acute Rehab PT Goals Patient Stated Goal: have less pain PT Goal Formulation: With patient Time For Goal Achievement: 02/06/15 Potential to Achieve Goals: Fair Progress towards PT goals: Progressing toward goals    Frequency  BID    PT Plan Current plan remains appropriate    Co-evaluation             End of Session Equipment Utilized During Treatment: Gait belt Activity Tolerance: Patient limited by pain Patient left: in chair;with call bell/phone within reach;with chair alarm set     Time: 0921-0949 PT Time Calculation (min) (ACUTE ONLY): 28 min  Charges:  $Therapeutic Activity: 23-37 mins                    G CodesHendricks Limes 02/13/2015, 12:18 PM Hendricks Limes, PT [phone]

## 2015-01-24 NOTE — Progress Notes (Signed)
Plan is for patient to D/C to [facility] tomorrow 01/25/15. Kim admissions coordinator at [facility] is aware of above. Clinical Social Worker (CSW) will continue to follow and assist as needed.   Jetta Lout, LCSWA [phone]

## 2015-01-25 ENCOUNTER — Encounter
Admission: RE | Admit: 2015-01-25 | Discharge: 2015-01-25 | Disposition: A | Discharge status: Home / Self Care | Payer: Medicare Other | Source: Ambulatory Visit | Attending: Internal Medicine | Admitting: Internal Medicine

## 2015-01-25 DIAGNOSIS — S72401A Unspecified fracture of lower end of right femur, initial encounter for closed fracture: Secondary | ICD-10-CM | POA: Diagnosis not present

## 2015-01-25 LAB — CBC WITH DIFFERENTIAL/PLATELET
Basophils Absolute: 0.1 10*3/uL (ref 0–0.1)
Basophils Relative: 1 %
EOS ABS: 0.1 10*3/uL (ref 0–0.7)
EOS PCT: 1 %
HCT: 24.7 % — ABNORMAL LOW (ref 35.0–47.0)
HEMOGLOBIN: 8.2 g/dL — AB (ref 12.0–16.0)
LYMPHS ABS: 2.2 10*3/uL (ref 1.0–3.6)
Lymphocytes Relative: 30 %
MCH: 31.9 pg (ref 26.0–34.0)
MCHC: 33.1 g/dL (ref 32.0–36.0)
MCV: 96.5 fL (ref 80.0–100.0)
MONO ABS: 0.5 10*3/uL (ref 0.2–0.9)
MONOS PCT: 7 %
NEUTROS PCT: 61 %
Neutro Abs: 4.3 10*3/uL (ref 1.4–6.5)
Platelets: 248 10*3/uL (ref 150–440)
RBC: 2.56 MIL/uL — ABNORMAL LOW (ref 3.80–5.20)
RDW: 15.6 % — AB (ref 11.5–14.5)
WBC: 7.2 10*3/uL (ref 3.6–11.0)

## 2015-01-25 LAB — PROTIME-INR
INR: 1.48
PROTHROMBIN TIME: 18.1 s — AB (ref 11.4–15.0)

## 2015-01-25 MED ORDER — WARFARIN SODIUM 2.5 MG PO TABS: 6.0000 mg | ORAL_TABLET | Freq: Once | ORAL | Status: DC

## 2015-01-25 MED ORDER — WARFARIN SODIUM 2.5 MG PO TABS: 5.0000 mg | ORAL_TABLET | Freq: Every day | ORAL | Status: DC

## 2015-01-25 NOTE — Progress Notes (Addendum)
Patient is medically stable for D/C to [facility] today. Per Kim admissions coordinator at [facility] patient is going to room 221-B. RN will call report at [phone] and will arrange EMS for transport. Clinical Child psychotherapist (CSW) prepared D/C packet and sent D/C Summary, Ortho Progress note, and follow up appointment to [employer] via carefinder. Patient is aware of above. CSW contacted patient's daughter Selena Batten and made her aware of above. Please reconsult if future social work needs arise. CSW signing off.   Room changed to 202-A. RN will call report at [phone].   Jetta Lout, LCSWA [phone]

## 2015-01-25 NOTE — Progress Notes (Signed)
Physical Therapy Treatment Patient Details Name: Victoria Johnston MRN: [id number] DOB: 09-Jul-1923 Today's Date: 01/25/2015    History of Present Illness Pt is an 81 y.o. female presenting to [facility] with R knee pain s/p fall and found to have acute periprosthetic distal R femur fx.  Pt s/p ORIF distal femur fx R 01/22/15 and is PWB'ing R LE.  Per PT's verbal discussion with MD Rosita Kea, pt does not need KI on operative extremity but can use if needed for quad weakness.  PMH:  Lumbar spinal stenosis, HLD, Crohn's disease, GERD, DVT on Coumadin, chronic systolic CHF, hearing loss.    PT Comments    Pt did fairly well with LE ex's in bed and able to progress with R knee AAROM flexion (ROM still limited though d/t pain).  Pt continues to be very motivated to participate in PT.  Plan to discharge to [facility] today.  Follow Up Recommendations  SNF     Equipment Recommendations  Rolling walker with 5" wheels    Recommendations for Other Services       Precautions / Restrictions Precautions Precautions: Fall;Knee Precaution Comments: No KI required but should use if pt has quad weakness; ok AAROM/AROM R knee ex's (per verbal PT discussion with MD Rosita Kea) Restrictions Weight Bearing Restrictions: Yes RLE Weight Bearing: Partial weight bearing LLE Weight Bearing: Weight bearing as tolerated    Mobility  Bed Mobility               General bed mobility comments: Not assessed this session  Transfers                 General transfer comment: Not assessed this session  Ambulation/Gait             General Gait Details: Not assessed this session   Stairs            Wheelchair Mobility    Modified Rankin (Stroke Patients Only)       Balance                                    Cognition Arousal/Alertness: Awake/alert Behavior During Therapy: WFL for tasks assessed/performed Overall Cognitive Status: Within Functional Limits for tasks assessed        Memory: Decreased recall of precautions              Exercises   Performed semi-supine B LE therapeutic exercise:  x10 Ankle pumps (AROM B LE's); x10 quad sets x3 second holds (AROM B LE's); x10 glute squeezes x3 second holds (AROM B); 2x10 SAQ's (AAROM R; AAROM L); 2x10 heelslides (AAROM R; AAROM L), 2x10 hip abd/adduction (AAROM R; AAROM L).  Pt required vc's and tactile cues for correct technique with exercises.     General Comments   Nursing cleared pt for participation in physical therapy.  Pt agreeable to PT session.      Pertinent Vitals/Pain Pain Assessment: 0-10 Pain Score: 6  Pain Location: R hip/knee Pain Descriptors / Indicators: Sharp;Shooting (with movement) Pain Intervention(s): Limited activity within patient's tolerance;Monitored during session;Repositioned  Vitals stable and WFL throughout treatment session.    Home Living                      Prior Function            PT Goals (current goals can now be found in the care plan section) Acute  Rehab PT Goals Patient Stated Goal: have less pain PT Goal Formulation: With patient Time For Goal Achievement: 02/06/15 Potential to Achieve Goals: Fair Progress towards PT goals: Progressing toward goals    Frequency  BID    PT Plan Current plan remains appropriate    Co-evaluation             End of Session   Activity Tolerance: Patient tolerated treatment well Patient left: in bed;with call bell/phone within reach;with bed alarm set;with SCD's reapplied     Time: 0842-0906 PT Time Calculation (min) (ACUTE ONLY): 24 min  Charges:  $Therapeutic Exercise: 23-37 mins                    G CodesHendricks Limes Jan 31, 2015, 9:19 AM Hendricks Limes, PT [phone]

## 2015-01-25 NOTE — Care Management Important Message (Signed)
Important Message  Patient Details  Name: Victoria Johnston MRN: [id number] Date of Birth: November 16, 1923   Medicare Important Message Given:  Yes-third notification given    [name] 01/25/2015, 9:40 AM

## 2015-01-25 NOTE — Clinical Social Work Placement (Signed)
   CLINICAL SOCIAL WORK PLACEMENT  NOTE  Date:  01/25/2015  Patient Details  Name: Victoria Johnston MRN: [id number] Date of Birth: Mar 08, 1924  Clinical Social Work is seeking post-discharge placement for this patient at the Skilled  Nursing Facility level of care (*CSW will initial, date and re-position this form in  chart as items are completed):  Yes   Patient/family provided with [facility] Clinical Social Work Department's list of facilities offering this level of care within the geographic area requested by the patient (or if unable, by the patient's family).  Yes   Patient/family informed of their freedom to choose among providers that offer the needed level of care, that participate in Medicare, Medicaid or managed care program needed by the patient, have an available bed and are willing to accept the patient.  Yes   Patient/family informed of [facility]'s ownership interest in [facility] and [facility], as well as of the fact that they are under no obligation to receive care at these facilities.  PASRR submitted to EDS on 01/21/15     PASRR number received on 01/21/15     Existing PASRR number confirmed on       FL2 transmitted to all facilities in geographic area requested by pt/family on 01/23/15     FL2 transmitted to all facilities within larger geographic area on       Patient informed that his/her managed care company has contracts with or will negotiate with certain facilities, including the following:        Yes   Patient/family informed of bed offers received.  Patient chooses bed at  [facility] )     Physician recommends and patient chooses bed at      Patient to be transferred to  [facility] ) on 01/25/15.  Patient to be transferred to facility by  [facility] )     Patient family notified on 01/25/15 of transfer.  Name of family member notified:   (Daughter Selena Batten is aware of D/C/ )     PHYSICIAN       Additional Comment:     _______________________________________________ Haig Prophet, LCSW 01/25/2015, 9:56 AM

## 2015-01-25 NOTE — Discharge Summary (Signed)
Victoria Johnston, is a 81 y.o. female  DOB 09-10-1923  MRN [id number].  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 Swelling   . Hearing loss   . Difficulty breathing   . Bruises easily   . Tremors of nervous system     Past Surgical History  Procedure Laterality Date  . Abdominal hysterectomy    . Knee surgery Bilateral   . Foot surgery Bilateral   . Orif femur fracture Right 01/22/2015    Procedure: OPEN REDUCTION INTERNAL FIXATION (ORIF) DISTAL FEMUR FRACTURE;  Surgeon: Kennedy Bucker, MD;  Location: [facility] ORS;  Service: Orthopedics;  Laterality: Right;       History of present illness and  Hospital Course:     Kindly see H&P for history of present illness and admission details, please review complete Labs, Consult reports and Test reports for all details in brief  HPI  from the history and physical done on the day of admission    Hospital Course    Patient underwent surgical repair of right femoral fracture, did well postop. Had significant anemia and was transfused 1 unit PRBCs, discharge hemoglobin stable. She has mild-to-moderate dementia with confusion, at bedtime Haldol works well. Topamax recently decreased in dose because higher doses created worsening altered mental status but it does help her tremor. UTI treated with IV Rocephin and then oral doxycycline, culture pending., Follow-up pro times, hemoglobin closely.   Discharge Condition:  Stable   Follow UP  Dr. Hyacinth Meeker 2 weeks    Discharge Instructions  and  Discharge Medications   Tylenol 650 g every 6 when necessary pain Albuterol 2 puffs every 6 when necessary Dulcolax 10 mg suppository when necessary Colace 100 mg twice a day Folate 1 mg daily Haloperidol 1 mg at bedtime Synthroid 75 g daily Ativan 1 mg 3 times a day when necessary anxiety Magnesium oxide 400 mg daily Doxycycline 100 mg twice a day 7 days Oxycodone 5 mg 1-2 tabs every 4 when necessary pain Protonix 40 mg twice a day Klor-Con 20 milliequivalents twice a day Propranolol 40 mg daily Senna one tab twice a day Topamax 50 mg twice a day Torsemide 30 mg daily B12 1000 mg daily Vitron C iron pill daily Coumadin 5 mg at bedtime  Today   Subjective:   Victoria Johnston today is stable for transfer to skilled nursing.   Objective:   Blood pressure 110/44, pulse 57, temperature 98.4 F (36.9 C), temperature source Oral, resp. rate 20, height  (1.702 m), weight 90.719 kg (200 lb), SpO2 98 %.   Exam Awake Alert, Oriented x 3, No new F.N deficits, Normal affect  [place]bur.AT,PERRAL Supple Neck,No JVD, No cervical lymphadenopathy appriciated.  Symmetrical Chest wall movement, Good air movement bilaterally, CTAB RRR,No Gallops,Rubs or new Murmurs, Abd Soft, Non tender, No rebound. No Cyanosis, Clubbing or edema, No new Rash or bruise  Total Time in preparing paper work, data evaluation and todays exam - 35 minutes  MILLER,MARK F. M.D on 01/25/2015 at 7:43 AM

## 2015-01-25 NOTE — Progress Notes (Signed)
   Subjective: 3 Days Post-Op Procedure(s) (LRB): OPEN REDUCTION INTERNAL FIXATION (ORIF) DISTAL FEMUR FRACTURE (Right) Patient reports pain as mild.   Patient is well, and has had no acute complaints or problems We will continue therapy today.  Plan is to go Rehab today  Objective: Vital signs in last 24 hours: Temp:  [98.2 F (36.8 C)-100.4 F (38 C)] 98.2 F (36.8 C) (09/28 0754) Pulse Rate:  [56-67] 63 (09/28 0754) Resp:  [18-20] 18 (09/28 0754) BP: (102-136)/(44-108) 136/108 mmHg (09/28 0754) SpO2:  [92 %-98 %] 97 % (09/28 0754)  Intake/Output from previous day: 09/27 0701 - 09/28 0700 In: 530 [P.O.:480; IV Piggyback:50] Out: -  Intake/Output this shift:     Recent Labs  01/23/15 0405 01/24/15 0654 01/25/15 0421  HGB 7.1* 7.3* 8.2*    Recent Labs  01/24/15 0654 01/25/15 0421  WBC 7.1 7.2  RBC 2.30* 2.56*  HCT 21.8* 24.7*  PLT 180 248    Recent Labs  01/23/15 0405 01/24/15 0654  NA 137 138  K 3.8 3.4*  CL 110 113*  CO2 22 22  BUN 14 14  CREATININE 1.03* 1.14*  GLUCOSE 95 107*  CALCIUM 7.4* 7.1*    Recent Labs  01/24/15 0654 01/25/15 0421  INR 1.55 1.48    EXAM General - Patient is Alert, Appropriate and Oriented Extremity - Neurovascular intact Sensation intact distally Intact pulses distally Dorsiflexion/Plantar flexion intact Dressing - dressing C/D/I and scant drainage. Immobilizer intact RLE Motor Function - intact, moving foot and toes well on exam.   Past Medical History  Diagnosis Date  . Swelling   . Hearing loss   . Difficulty breathing   . Bruises easily   . Tremors of nervous system     Assessment/Plan:   3 Days Post-Op Procedure(s) (LRB): OPEN REDUCTION INTERNAL FIXATION (ORIF) DISTAL FEMUR FRACTURE (Right) Active Problems:   Femur fracture   Acute post op blood loss anemia   Estimated body mass index is 31.32 kg/(m^2) as calculated from the following:   Height as of this encounter:  (1.702 m).   Weight  as of this encounter: 90.719 kg (200 lb). Advance diet Up with therapy Discharge to SNF  Follow up with [facility] in 2 weeks  DVT Prophylaxis - Coumadin Partial weight-Bearing Right lower extremity D/C O2 and Pulse OX and try on Room Air  T. Cranston Neighbor, PA-C [facility] 01/25/2015, 8:23 AM

## 2015-01-25 NOTE — Progress Notes (Signed)
ANTICOAGULATION CONSULT NOTE - FOLLOW UP   Pharmacy Consult for Warfarin Indication: VTE prophylaxis  No Known Allergies  Patient Measurements: Height: 5\' 7"  (170.2 cm) Weight: 200 lb (90.719 kg) IBW/kg (Calculated) : 61.6 Heparin Dosing Weight:   Vital Signs: Temp: 98.2 F (36.8 C) (09/28 0754) Temp Source: Oral (09/28 0754) BP: 136/108 mmHg (09/28 0754) Pulse Rate: 63 (09/28 0754)  Labs:  Recent Labs  01/23/15 0405 01/24/15 0654 01/25/15 0421  HGB 7.1* 7.3* 8.2*  HCT 21.4* 21.8* 24.7*  PLT 151 180 248  LABPROT 18.0* 18.8* 18.1*  INR 1.47 1.55 1.48  CREATININE 1.03* 1.14*  --     Estimated Creatinine Clearance: 38.7 mL/min (by C-G formula based on Cr of 1.14).   Medical History: Past Medical History  Diagnosis Date  . Swelling   . Hearing loss   . Difficulty breathing   . Bruises easily   . Tremors of nervous system     Medications:  Prescriptions prior to admission  Medication Sig Dispense Refill Last Dose  . albuterol (PROVENTIL HFA;VENTOLIN HFA) 108 (90 BASE) MCG/ACT inhaler Inhale 2 puffs into the lungs every 6 (six) hours as needed for wheezing or shortness of breath.   PRN at PRN  . BIOTIN PO Take 1 capsule by mouth daily at 12 noon.   01/19/2015 at Unknown time  . folic acid (FOLVITE) 1 MG tablet Take 1 mg by mouth daily.   01/19/2015 at Unknown time  . HYDROcodone-acetaminophen (NORCO/VICODIN) 5-325 MG per tablet Take 0.5 tablets by mouth at bedtime.   01/20/2015 at 0000  . Iron-Vitamin C (VITRON-C) 65-125 MG TABS Take 1 tablet by mouth daily.   01/20/2015 at Unknown time  . levothyroxine (SYNTHROID, LEVOTHROID) 75 MCG tablet Take 75 mcg by mouth daily before breakfast.   01/19/2015 at Unknown time  . LORazepam (ATIVAN) 1 MG tablet Take 1 mg by mouth 3 (three) times daily.    01/19/2015 at Unknown time  . magnesium oxide (MAG-OX) 400 MG tablet Take 400 mg by mouth daily.   01/19/2015 at Unknown time  . nitroGLYCERIN (NITROSTAT) 0.4 MG SL tablet Place 0.4  mg under the tongue every 5 (five) minutes as needed for chest pain.   PRN at PRN  . pantoprazole (PROTONIX) 40 MG tablet Take 40 mg by mouth 2 (two) times daily.   01/19/2015 at Unknown time  . potassium chloride SA (K-DUR,KLOR-CON) 20 MEQ tablet Take 20 mEq by mouth 2 (two) times daily.     . propranolol (INDERAL) 40 MG tablet Take 40 mg by mouth daily.   01/19/2015 at Unsure   . sulfaSALAzine (AZULFIDINE) 500 MG tablet Take 500-1,000 mg by mouth 3 (three) times daily. Pt takes two in the morning, two at noon, and one at night.   01/19/2015 at Unknown time  . topiramate (TOPAMAX) 100 MG tablet Take 50 mg by mouth 2 (two) times daily.   01/19/2015 at Unknown time  . torsemide (DEMADEX) 20 MG tablet Take 30 mg by mouth daily.     . traMADol (ULTRAM) 50 MG tablet Take 25 mg by mouth daily.   01/19/2015 at Unsure   . vitamin B-12 (CYANOCOBALAMIN) 1000 MCG tablet Take 1,000 mcg by mouth daily at 12 noon.   01/19/2015 at Unknown time  . warfarin (COUMADIN) 5 MG tablet Take 5 mg by mouth at bedtime.   01/19/2015 at 0000  . [DISCONTINUED] haloperidol (HALDOL) 1 MG tablet Take 2 mg by mouth at bedtime.   01/19/2015 at Unknown  time    Assessment: Pharmacy consulted to dose warfarin in this 81 year old female S/P surgical repair of femur fracture.   MD wants to resume warfarin for DVT prophylaxis.  9/28 INR= 1.48  Goal of Therapy:  INR 2-3  Plan:  Will give warfarin 6 mg PO x 1 then will continue warfarin 5 mg PO q1800 on 9/29.   Demetrius Charity, PharmD Clinical Pharmacist

## 2015-01-26 LAB — URINE CULTURE

## 2015-01-28 ENCOUNTER — Encounter
Admission: RE | Admit: 2015-01-28 | Discharge: 2015-01-28 | Disposition: A | Discharge status: Home / Self Care | Payer: Medicare Other | Source: Ambulatory Visit | Attending: Internal Medicine | Admitting: Internal Medicine

## 2015-03-12 ENCOUNTER — Encounter: Payer: Self-pay | Admitting: *Deleted

## 2015-03-12 ENCOUNTER — Emergency Department: Payer: Medicare Other

## 2015-03-12 ENCOUNTER — Observation Stay
Admission: EM | Admit: 2015-03-12 | Discharge: 2015-03-14 | Disposition: A | Discharge status: Skilled Nursing Facility | Payer: Medicare Other | Attending: Internal Medicine | Admitting: Internal Medicine

## 2015-03-12 DIAGNOSIS — Z833 Family history of diabetes mellitus: Secondary | ICD-10-CM | POA: Diagnosis not present

## 2015-03-12 DIAGNOSIS — S0990XA Unspecified injury of head, initial encounter: Secondary | ICD-10-CM

## 2015-03-12 DIAGNOSIS — E785 Hyperlipidemia, unspecified: Secondary | ICD-10-CM | POA: Insufficient documentation

## 2015-03-12 DIAGNOSIS — J9811 Atelectasis: Secondary | ICD-10-CM | POA: Diagnosis not present

## 2015-03-12 DIAGNOSIS — T148 Other injury of unspecified body region: Secondary | ICD-10-CM | POA: Diagnosis present

## 2015-03-12 DIAGNOSIS — N3000 Acute cystitis without hematuria: Secondary | ICD-10-CM

## 2015-03-12 DIAGNOSIS — Z8249 Family history of ischemic heart disease and other diseases of the circulatory system: Secondary | ICD-10-CM | POA: Insufficient documentation

## 2015-03-12 DIAGNOSIS — I071 Rheumatic tricuspid insufficiency: Secondary | ICD-10-CM | POA: Insufficient documentation

## 2015-03-12 DIAGNOSIS — Z79899 Other long term (current) drug therapy: Secondary | ICD-10-CM | POA: Diagnosis not present

## 2015-03-12 DIAGNOSIS — R778 Other specified abnormalities of plasma proteins: Secondary | ICD-10-CM | POA: Diagnosis present

## 2015-03-12 DIAGNOSIS — S51812A Laceration without foreign body of left forearm, initial encounter: Secondary | ICD-10-CM | POA: Insufficient documentation

## 2015-03-12 DIAGNOSIS — S0181XA Laceration without foreign body of other part of head, initial encounter: Secondary | ICD-10-CM | POA: Insufficient documentation

## 2015-03-12 DIAGNOSIS — I5189 Other ill-defined heart diseases: Secondary | ICD-10-CM | POA: Insufficient documentation

## 2015-03-12 DIAGNOSIS — I7300 Raynaud's syndrome without gangrene: Secondary | ICD-10-CM | POA: Insufficient documentation

## 2015-03-12 DIAGNOSIS — M79604 Pain in right leg: Secondary | ICD-10-CM

## 2015-03-12 DIAGNOSIS — K219 Gastro-esophageal reflux disease without esophagitis: Secondary | ICD-10-CM | POA: Diagnosis not present

## 2015-03-12 DIAGNOSIS — I371 Nonrheumatic pulmonary valve insufficiency: Secondary | ICD-10-CM | POA: Insufficient documentation

## 2015-03-12 DIAGNOSIS — Y999 Unspecified external cause status: Secondary | ICD-10-CM | POA: Insufficient documentation

## 2015-03-12 DIAGNOSIS — N390 Urinary tract infection, site not specified: Secondary | ICD-10-CM | POA: Diagnosis not present

## 2015-03-12 DIAGNOSIS — Y92129 Unspecified place in nursing home as the place of occurrence of the external cause: Secondary | ICD-10-CM | POA: Insufficient documentation

## 2015-03-12 DIAGNOSIS — R7989 Other specified abnormal findings of blood chemistry: Secondary | ICD-10-CM | POA: Diagnosis present

## 2015-03-12 DIAGNOSIS — Z90710 Acquired absence of both cervix and uterus: Secondary | ICD-10-CM | POA: Insufficient documentation

## 2015-03-12 DIAGNOSIS — I130 Hypertensive heart and chronic kidney disease with heart failure and stage 1 through stage 4 chronic kidney disease, or unspecified chronic kidney disease: Secondary | ICD-10-CM | POA: Diagnosis not present

## 2015-03-12 DIAGNOSIS — N189 Chronic kidney disease, unspecified: Secondary | ICD-10-CM | POA: Diagnosis not present

## 2015-03-12 DIAGNOSIS — Z96651 Presence of right artificial knee joint: Secondary | ICD-10-CM | POA: Insufficient documentation

## 2015-03-12 DIAGNOSIS — R55 Syncope and collapse: Secondary | ICD-10-CM | POA: Diagnosis not present

## 2015-03-12 DIAGNOSIS — N179 Acute kidney failure, unspecified: Secondary | ICD-10-CM | POA: Insufficient documentation

## 2015-03-12 DIAGNOSIS — Z7901 Long term (current) use of anticoagulants: Secondary | ICD-10-CM | POA: Insufficient documentation

## 2015-03-12 DIAGNOSIS — W19XXXA Unspecified fall, initial encounter: Secondary | ICD-10-CM | POA: Diagnosis present

## 2015-03-12 DIAGNOSIS — R748 Abnormal levels of other serum enzymes: Principal | ICD-10-CM | POA: Insufficient documentation

## 2015-03-12 DIAGNOSIS — F419 Anxiety disorder, unspecified: Secondary | ICD-10-CM | POA: Diagnosis not present

## 2015-03-12 DIAGNOSIS — M7989 Other specified soft tissue disorders: Secondary | ICD-10-CM | POA: Diagnosis not present

## 2015-03-12 DIAGNOSIS — J449 Chronic obstructive pulmonary disease, unspecified: Secondary | ICD-10-CM | POA: Insufficient documentation

## 2015-03-12 DIAGNOSIS — I509 Heart failure, unspecified: Secondary | ICD-10-CM | POA: Insufficient documentation

## 2015-03-12 DIAGNOSIS — Z86718 Personal history of other venous thrombosis and embolism: Secondary | ICD-10-CM | POA: Diagnosis not present

## 2015-03-12 DIAGNOSIS — R52 Pain, unspecified: Secondary | ICD-10-CM

## 2015-03-12 DIAGNOSIS — B9620 Unspecified Escherichia coli [E. coli] as the cause of diseases classified elsewhere: Secondary | ICD-10-CM | POA: Diagnosis not present

## 2015-03-12 DIAGNOSIS — K5090 Crohn's disease, unspecified, without complications: Secondary | ICD-10-CM | POA: Diagnosis not present

## 2015-03-12 DIAGNOSIS — IMO0002 Reserved for concepts with insufficient information to code with codable children: Secondary | ICD-10-CM

## 2015-03-12 DIAGNOSIS — Y939 Activity, unspecified: Secondary | ICD-10-CM | POA: Insufficient documentation

## 2015-03-12 DIAGNOSIS — W010XXA Fall on same level from slipping, tripping and stumbling without subsequent striking against object, initial encounter: Secondary | ICD-10-CM | POA: Diagnosis not present

## 2015-03-12 DIAGNOSIS — I10 Essential (primary) hypertension: Secondary | ICD-10-CM | POA: Diagnosis present

## 2015-03-12 DIAGNOSIS — I340 Nonrheumatic mitral (valve) insufficiency: Secondary | ICD-10-CM | POA: Diagnosis not present

## 2015-03-12 DIAGNOSIS — E039 Hypothyroidism, unspecified: Secondary | ICD-10-CM | POA: Diagnosis present

## 2015-03-12 HISTORY — DX: Other specified chronic obstructive pulmonary disease: J44.89

## 2015-03-12 HISTORY — DX: Essential (primary) hypertension: I10

## 2015-03-12 HISTORY — DX: Deep phlebothrombosis in pregnancy, unspecified trimester: O22.30

## 2015-03-12 HISTORY — DX: Crohn's disease, unspecified, without complications: K50.90

## 2015-03-12 HISTORY — DX: Gastro-esophageal reflux disease without esophagitis: K21.9

## 2015-03-12 HISTORY — DX: Raynaud's syndrome without gangrene: I73.00

## 2015-03-12 HISTORY — DX: Chronic obstructive pulmonary disease, unspecified: J44.9

## 2015-03-12 HISTORY — DX: Anxiety disorder, unspecified: F41.9

## 2015-03-12 HISTORY — DX: Heart failure, unspecified: I50.9

## 2015-03-12 HISTORY — DX: Unspecified osteoarthritis, unspecified site: M19.90

## 2015-03-12 HISTORY — DX: Hyperlipidemia, unspecified: E78.5

## 2015-03-12 LAB — TROPONIN I: TROPONIN I: 0.23 ng/mL — AB (ref <0.031)

## 2015-03-12 LAB — BASIC METABOLIC PANEL
Anion gap: 6 (ref 5–15)
BUN: 25 mg/dL — AB (ref 6–20)
CHLORIDE: 110 mmol/L (ref 101–111)
CO2: 27 mmol/L (ref 22–32)
CREATININE: 1.56 mg/dL — AB (ref 0.44–1.00)
Calcium: 8.4 mg/dL — ABNORMAL LOW (ref 8.9–10.3)
GFR calc non Af Amer: 28 mL/min — ABNORMAL LOW (ref >60)
GFR, EST AFRICAN AMERICAN: 33 mL/min — AB (ref >60)
Glucose, Bld: 113 mg/dL — ABNORMAL HIGH (ref 65–99)
Potassium: 4.4 mmol/L (ref 3.5–5.1)
Sodium: 143 mmol/L (ref 135–145)

## 2015-03-12 LAB — CBC WITH DIFFERENTIAL/PLATELET
BASOS PCT: 1 %
Basophils Absolute: 0.1 10*3/uL (ref 0–0.1)
EOS ABS: 0.2 10*3/uL (ref 0–0.7)
Eosinophils Relative: 3 %
HCT: 36.2 % (ref 35.0–47.0)
Hemoglobin: 11.8 g/dL — ABNORMAL LOW (ref 12.0–16.0)
Lymphocytes Relative: 27 %
Lymphs Abs: 2.5 10*3/uL (ref 1.0–3.6)
MCH: 31.5 pg (ref 26.0–34.0)
MCHC: 32.6 g/dL (ref 32.0–36.0)
MCV: 96.7 fL (ref 80.0–100.0)
MONO ABS: 0.6 10*3/uL (ref 0.2–0.9)
MONOS PCT: 6 %
NEUTROS PCT: 63 %
Neutro Abs: 6 10*3/uL (ref 1.4–6.5)
Platelets: 231 10*3/uL (ref 150–440)
RBC: 3.74 MIL/uL — ABNORMAL LOW (ref 3.80–5.20)
RDW: 14.5 % (ref 11.5–14.5)
WBC: 9.4 10*3/uL (ref 3.6–11.0)

## 2015-03-12 LAB — PROTIME-INR
INR: 2.22
Prothrombin Time: 24.4 seconds — ABNORMAL HIGH (ref 11.4–15.0)

## 2015-03-12 MED ORDER — OXYCODONE HCL 5 MG PO TABS: 5.0000 mg | ORAL_TABLET | ORAL | Status: DC | PRN

## 2015-03-12 MED ORDER — SODIUM CHLORIDE 0.9 % IJ SOLN
3.0000 mL | Freq: Two times a day (BID) | INTRAMUSCULAR | Status: DC
  Administered 2015-03-13 – 2015-03-14 (×3): 3 mL via INTRAVENOUS

## 2015-03-12 MED ORDER — HALOPERIDOL 1 MG PO TABS
1.0000 mg | ORAL_TABLET | Freq: Every day | ORAL | Status: DC
  Administered 2015-03-13 (×2): 1 mg via ORAL
  Filled 2015-03-12 (×3): qty 1

## 2015-03-12 MED ORDER — LEVOTHYROXINE SODIUM 150 MCG PO TABS
75.0000 ug | ORAL_TABLET | Freq: Every day | ORAL | Status: DC
  Administered 2015-03-13 – 2015-03-14 (×2): 75 ug via ORAL
  Filled 2015-03-12 (×2): qty 1

## 2015-03-12 MED ORDER — ACETAMINOPHEN 325 MG PO TABS: 650.0000 mg | ORAL_TABLET | Freq: Four times a day (QID) | ORAL | Status: DC | PRN

## 2015-03-12 MED ORDER — TOPIRAMATE 100 MG PO TABS
50.0000 mg | ORAL_TABLET | Freq: Two times a day (BID) | ORAL | Status: DC
  Administered 2015-03-13 – 2015-03-14 (×4): 50 mg via ORAL
  Filled 2015-03-12: qty 2
  Filled 2015-03-12: qty 0.5
  Filled 2015-03-12: qty 2
  Filled 2015-03-12: qty 0.5
  Filled 2015-03-12 (×2): qty 2

## 2015-03-12 MED ORDER — WARFARIN SODIUM 5 MG PO TABS: 5.0000 mg | ORAL_TABLET | Freq: Every day | ORAL | Status: DC

## 2015-03-12 MED ORDER — LORAZEPAM 1 MG PO TABS: 1.0000 mg | ORAL_TABLET | Freq: Three times a day (TID) | ORAL | Status: DC | PRN

## 2015-03-12 MED ORDER — ONDANSETRON HCL 4 MG PO TABS: 4.0000 mg | ORAL_TABLET | Freq: Four times a day (QID) | ORAL | Status: DC | PRN

## 2015-03-12 MED ORDER — ONDANSETRON HCL 4 MG/2ML IJ SOLN: 4.0000 mg | Freq: Four times a day (QID) | INTRAMUSCULAR | Status: DC | PRN

## 2015-03-12 MED ORDER — PANTOPRAZOLE SODIUM 40 MG PO TBEC
40.0000 mg | DELAYED_RELEASE_TABLET | Freq: Every day | ORAL | Status: DC
  Administered 2015-03-13 – 2015-03-14 (×2): 40 mg via ORAL
  Filled 2015-03-12 (×2): qty 1

## 2015-03-12 NOTE — ED Provider Notes (Addendum)
----------------------------------------- 7:43 PM on 03/12/2015 -----------------------------------------  [facility] Emergency Department Provider Note  ____________________________________________   I have reviewed the triage vital signs and the nursing notes.   HISTORY  Chief Complaint Fall and Laceration    HPI Victoria Johnston is a 81 y.o. female who fell today at the nursing home. The patient is on Coumadin for DVTs, she states that she did not pass out although she has no clear idea why she fell and does not actually Rembert following, she did her head. She's not exactly sure why she fell, she believes she tripped. She has had no nausea or vomiting. She denies chest pain shortness of breath. She denies focal hip or leg pain. She has a skin tear to her left forehead and some skin injury to her left upper arm. She has no other complaints and feels pretty good she states.  Past Medical History  Diagnosis Date  . Swelling   . Hearing loss   . Difficulty breathing   . Bruises easily   . Tremors of nervous system     Patient Active Problem List   Diagnosis Date Noted  . Femur fracture (HCC) 01/20/2015    Past Surgical History  Procedure Laterality Date  . Abdominal hysterectomy    . Knee surgery Bilateral   . Foot surgery Bilateral   . Orif femur fracture Right 01/22/2015    Procedure: OPEN REDUCTION INTERNAL FIXATION (ORIF) DISTAL FEMUR FRACTURE;  Surgeon: Kennedy Bucker, MD;  Location: [facility] ORS;  Service: Orthopedics;  Laterality: Right;    Current Outpatient Rx  Name  Route  Sig  Dispense  Refill  . acetaminophen (TYLENOL) 325 MG tablet   Oral   Take 2 tablets (650 mg total) by mouth every 6 (six) hours as needed for mild pain (or Fever >/= 101).   1 tablet   1   . albuterol (PROVENTIL HFA;VENTOLIN HFA) 108 (90 BASE) MCG/ACT inhaler   Inhalation   Inhale 2 puffs into the lungs every 6 (six) hours as needed for wheezing or shortness of  breath.         . bisacodyl (DULCOLAX) 10 MG suppository   Rectal   Place 1 suppository (10 mg total) rectally daily as needed for moderate constipation.   12 suppository   0   . docusate sodium (COLACE) 100 MG capsule   Oral   Take 1 capsule (100 mg total) by mouth 2 (two) times daily.   10 capsule   0   . folic acid (FOLVITE) 1 MG tablet   Oral   Take 1 mg by mouth daily.         . haloperidol (HALDOL) 1 MG tablet   Oral   Take 1 tablet (1 mg total) by mouth at bedtime.   1 tablet   1   . Iron-Vitamin C (VITRON-C) 65-125 MG TABS   Oral   Take 1 tablet by mouth daily.         Marland Kitchen levothyroxine (SYNTHROID, LEVOTHROID) 75 MCG tablet   Oral   Take 75 mcg by mouth daily before breakfast.         . LORazepam (ATIVAN) 1 MG tablet   Oral   Take 1 tablet (1 mg total) by mouth every 8 (eight) hours as needed for anxiety.   30 tablet   0   . magnesium oxide (MAG-OX) 400 MG tablet   Oral   Take 400 mg by mouth daily.         Marland Kitchen  minocycline (MINOCIN,DYNACIN) 100 MG capsule   Oral   Take 1 capsule (100 mg total) by mouth 2 (two) times daily.   14 capsule   0   . nitroGLYCERIN (NITROSTAT) 0.4 MG SL tablet   Sublingual   Place 0.4 mg under the tongue every 5 (five) minutes as needed for chest pain.         Marland Kitchen oxyCODONE (OXY IR/ROXICODONE) 5 MG immediate release tablet   Oral   Take 1-2 tablets (5-10 mg total) by mouth every 4 (four) hours as needed for breakthrough pain ((for MODERATE breakthrough pain)).   30 tablet   0   . pantoprazole (PROTONIX) 40 MG tablet   Oral   Take 40 mg by mouth 2 (two) times daily.         . potassium chloride SA (K-DUR,KLOR-CON) 20 MEQ tablet   Oral   Take 20 mEq by mouth 2 (two) times daily.         . propranolol (INDERAL) 40 MG tablet   Oral   Take 40 mg by mouth daily.         Marland Kitchen senna (SENOKOT) 8.6 MG TABS tablet   Oral   Take 1 tablet (8.6 mg total) by mouth 2 (two) times daily.   120 each   0   . topiramate  (TOPAMAX) 100 MG tablet   Oral   Take 50 mg by mouth 2 (two) times daily.         Marland Kitchen torsemide (DEMADEX) 20 MG tablet   Oral   Take 30 mg by mouth daily.         . vitamin B-12 (CYANOCOBALAMIN) 1000 MCG tablet   Oral   Take 1,000 mcg by mouth daily at 12 noon.         . warfarin (COUMADIN) 5 MG tablet   Oral   Take 5 mg by mouth at bedtime.           Allergies Review of patient's allergies indicates no known allergies.  No family history on file.  Social History Social History  Substance Use Topics  . Smoking status: Never Smoker   . Smokeless tobacco: Never Used  . Alcohol Use: No    Review of Systems Constitutional: No fever/chills Eyes: No visual changes. ENT: No sore throat. No stiff neck no neck pain Cardiovascular: Denies chest pain. Respiratory: Denies shortness of breath. Gastrointestinal:   no vomiting.  No diarrhea.  No constipation. Genitourinary: Negative for dysuria. Musculoskeletal: Negative lower extremity swelling Skin: Negative for rash. Neurological: Negative for headaches, focal weakness or numbness. 10-point ROS otherwise negative.  ____________________________________________   PHYSICAL EXAM:  VITAL SIGNS: ED Triage Vitals  Enc Vitals Group     BP 03/12/15 1936 138/75 mmHg     Pulse Rate 03/12/15 1936 69     Resp 03/12/15 1936 2     Temp 03/12/15 1936 97.6 F (36.4 C)     Temp Source 03/12/15 1936 Oral     SpO2 03/12/15 1936 99 %     Weight 03/12/15 1936 199 lb (90.266 kg)     Height 03/12/15 1936  (1.651 m)     Head Cir --      Peak Flow --      Pain Score 03/12/15 1938 3     Pain Loc --      Pain Edu? --      Excl. in GC? --     Constitutional: Alert and oriented to name and  place not quite sure of the date. Well appearing and in no acute distress. Eyes: Conjunctivae are normal. PERRL. EOMI. Head: Bruising and abrasion to left forehead no skull fracture palpated. Nose: No congestion/rhinnorhea. Mouth/Throat:  Mucous membranes are moist.  Oropharynx non-erythematous. Neck: No stridor.   Nontender with no meningismus Cardiovascular: Normal rate, regular rhythm. Grossly normal heart sounds.  Good peripheral circulation. Respiratory: Normal respiratory effort.  No retractions. Lungs CTAB. Abdominal: Soft and nontender. No distention. No guarding no rebound Back:  There is no focal tenderness or step off there is no midline tenderness there are no lesions noted. there is no CVA tenderness Musculoskeletal: No lower extremity tenderness. No joint effusions, chronic-appearing bilateral lower showed a mild edema right greater than left but no evidence of fracture. Neurologic:  Normal speech and language. No gross focal neurologic deficits are appreciated.  Skin:  Skin is warm, dry very superficial skin tears noted to the left upper extremity see above regarding forehead Psychiatric: Mood and affect are normal. Speech and behavior are normal.  ____________________________________________   LABS (all labs ordered are listed, but only abnormal results are displayed)  Labs Reviewed  CBC WITH DIFFERENTIAL/PLATELET  TROPONIN I  BASIC METABOLIC PANEL  PROTIME-INR   ____________________________________________  EKG  I personally interpreted any EKGs ordered by me or triage Normal sinus rhythm rate 69 bpm, LAD noted, no acute ST elevation or depression nonspecific ST changes ____________________________________________  RADIOLOGY  I reviewed any imaging ordered by me or triage that were performed during my shift ____________________________________________   PROCEDURES  Procedure(s) performed: Forehead laceration: There is an abrasion/laceration to the forehead. Irrigated and cleaned well, Dermabond applied with good results. Patient tolerated procedure well no complications  Critical Care performed: None  ____________________________________________   INITIAL IMPRESSION / ASSESSMENT AND PLAN /  ED COURSE  Pertinent labs & imaging results that were available during my care of the patient were reviewed by me and considered in my medical decision making (see chart for details).  Patient with what sounds like a non-sequel fall, she is at her baseline per EMS, she is on Coumadin according to notes we will obtain a CT scan of her head to rule out a cranial bleed although I have low suspicion of a significant bleed given how good she looks. Vital signs are reassuring. Patient is a poor historian is unclear if this was a syncopal or non-syncopal event, ____________________________________________  ----------------------------------------- 9:46 PM on 03/12/2015 -----------------------------------------  Patient has no chest pain or short of breath. On further evaluation of this abrasion/laceration to her forehead, it is amenable to Dermabond we will put Dermabond on it. CT is negative. Because the patient has no recollection of why she fell, we did do a workup as she could've had a dysrhythmia or other cause. EKG is reassuring however, troponin is 0.23. Her creatinine is also mildly elevated over baseline.  FINAL CLINICAL IMPRESSION(S) / ED DIAGNOSES  Final diagnoses:  None     Jeanmarie Plant, MD 03/12/15 1947  Jeanmarie Plant, MD 03/12/15 9604  Jeanmarie Plant, MD 03/12/15 2249

## 2015-03-12 NOTE — ED Notes (Signed)
Pt brought in via ems from the nursing home.  Pt has a laceration to left forehead.  Pt has abrasion to left forearm.  No loc.  No vomiting.  Pt alert and speech is clear.  md at bedside.

## 2015-03-12 NOTE — ED Notes (Signed)
Patient transported to X-ray 

## 2015-03-12 NOTE — ED Notes (Signed)
Family in with pt.  Daughter states she will wait in the lobby.  Pt alert.  Iv in place.

## 2015-03-12 NOTE — ED Notes (Signed)
dermabond applied to left forehead and left arm by dr Alphonzo Lemmings.  Family in with pt.

## 2015-03-12 NOTE — ED Notes (Signed)
primedoc in with pt for admission.  

## 2015-03-12 NOTE — ED Notes (Signed)
Patient transported to CT 

## 2015-03-12 NOTE — ED Notes (Signed)
Pt brought in via ems from [facility].  Pt fell and struck head on the floor.  Pt has laceration to left forehead.  Bleeding controlled  No loc.  No vomiting.  Pt alert on arrival to er.

## 2015-03-12 NOTE — H&P (Signed)
[facility] [place] [facility]   PATIENT NAME: Victoria Johnston    MR#:  [id number]  DATE OF BIRTH:  05-02-1923  DATE OF ADMISSION:  03/12/2015  PRIMARY CARE PHYSICIAN: Danella Penton., MD   REQUESTING/REFERRING PHYSICIAN: Vincenza Hews, M.D.  CHIEF COMPLAINT:   Chief Complaint  Patient presents with  . Fall  . Laceration    HISTORY OF PRESENT ILLNESS:  Victoria Johnston  is a 81 y.o. female who presents with fall. Patient is a resident of local nursing facility, and had a fall of unknown etiology tonight. She was sent to the ED for evaluation. Patient does not recall the details surrounding the fall event. Her daughters present in the ED with her and contributes to history of present illness, however was not present during the fall event. Patient suffered skin tear to her for head into her left forearm. Otherwise no significant traumatic impact from the fall. However, laboratory evaluation in the ED revealed elevated troponin is 0.23. Given the paucity of information the details surrounding the fall event, with possible syncope, hospitalists were called for admission to evaluate and monitor the patient due to her elevated troponin.  PAST MEDICAL HISTORY:   Past Medical History  Diagnosis Date  . Swelling   . Hearing loss   . Difficulty breathing   . Bruises easily   . Tremors of nervous system   . HLD (hyperlipidemia)   . Crohn disease (HCC)   . GERD (gastroesophageal reflux disease)   . Raynaud disease   . HTN (hypertension)   . OA (osteoarthritis)   . Chronic obstructive asthma (HCC)   . CHF (congestive heart failure) (HCC)   . DVT (deep vein thrombosis) in pregnancy   . Anxiety     PAST SURGICAL HISTORY:   Past Surgical History  Procedure Laterality Date  . Abdominal hysterectomy    . Knee surgery Bilateral   . Foot surgery Bilateral   . Orif femur fracture Right 01/22/2015    Procedure: OPEN REDUCTION INTERNAL FIXATION (ORIF) DISTAL FEMUR  FRACTURE;  Surgeon: Kennedy Bucker, MD;  Location: [facility] ORS;  Service: Orthopedics;  Laterality: Right;    SOCIAL HISTORY:   Social History  Substance Use Topics  . Smoking status: Never Smoker   . Smokeless tobacco: Never Used  . Alcohol Use: No    FAMILY HISTORY:   Family History  Problem Relation Age of Onset  . CAD    . Diabetes    . Hypertension      DRUG ALLERGIES:   Allergies  Allergen Reactions  . Codeine Nausea Only  . Lexapro [Escitalopram Oxalate]   . Lyrica [Pregabalin]   . Mysoline [Primidone]     MEDICATIONS AT HOME:   Prior to Admission medications   Medication Sig Start Date End Date Taking? Authorizing Provider  cholecalciferol (VITAMIN D) 1000 UNITS tablet Take 4,000 Units by mouth daily.   Yes Historical Provider, MD  docusate sodium (COLACE) 100 MG capsule Take 1 capsule (100 mg total) by mouth 2 (two) times daily. 01/24/15  Yes Danella Penton, MD  folic acid (FOLVITE) 1 MG tablet Take 1 mg by mouth daily.   Yes Historical Provider, MD  haloperidol (HALDOL) 1 MG tablet Take 1 tablet (1 mg total) by mouth at bedtime. 01/24/15  Yes Danella Penton, MD  Iron-Vitamin C (VITRON-C) 65-125 MG TABS Take 1 tablet by mouth daily.   Yes Historical Provider, MD  levothyroxine (SYNTHROID, LEVOTHROID) 75 MCG tablet Take 75 mcg  by mouth daily before breakfast.   Yes Historical Provider, MD  LORazepam (ATIVAN) 1 MG tablet Take 1 tablet (1 mg total) by mouth every 8 (eight) hours as needed for anxiety. 01/24/15  Yes Danella Penton, MD  magnesium oxide (MAG-OX) 400 MG tablet Take 400 mg by mouth daily.   Yes Historical Provider, MD  nitroGLYCERIN (NITROSTAT) 0.4 MG SL tablet Place 0.4 mg under the tongue every 5 (five) minutes as needed for chest pain.   Yes Historical Provider, MD  omeprazole (PRILOSEC) 20 MG capsule Take 20 mg by mouth daily.   Yes Historical Provider, MD  oxyCODONE (OXY IR/ROXICODONE) 5 MG immediate release tablet Take 1-2 tablets (5-10 mg total) by mouth  every 4 (four) hours as needed for breakthrough pain ((for MODERATE breakthrough pain)). 01/24/15  Yes Danella Penton, MD  potassium chloride SA (K-DUR,KLOR-CON) 20 MEQ tablet Take 20 mEq by mouth 2 (two) times daily.   Yes Historical Provider, MD  propranolol (INDERAL) 40 MG tablet Take 40 mg by mouth daily.   Yes Historical Provider, MD  senna (SENOKOT) 8.6 MG TABS tablet Take 1 tablet (8.6 mg total) by mouth 2 (two) times daily. 01/24/15  Yes Danella Penton, MD  topiramate (TOPAMAX) 100 MG tablet Take 50 mg by mouth 2 (two) times daily.   Yes Historical Provider, MD  torsemide (DEMADEX) 20 MG tablet Take 30 mg by mouth daily.   Yes Historical Provider, MD  vitamin B-12 (CYANOCOBALAMIN) 1000 MCG tablet Take 1,000 mcg by mouth daily at 12 noon.   Yes Historical Provider, MD  warfarin (COUMADIN) 5 MG tablet Take 5 mg by mouth at bedtime.   Yes Historical Provider, MD  acetaminophen (TYLENOL) 325 MG tablet Take 2 tablets (650 mg total) by mouth every 6 (six) hours as needed for mild pain (or Fever >/= 101). Patient not taking: Reported on 03/12/2015 01/24/15   Danella Penton, MD  bisacodyl (DULCOLAX) 10 MG suppository Place 1 suppository (10 mg total) rectally daily as needed for moderate constipation. Patient not taking: Reported on 03/12/2015 01/24/15   Danella Penton, MD  minocycline (MINOCIN,DYNACIN) 100 MG capsule Take 1 capsule (100 mg total) by mouth 2 (two) times daily. Patient not taking: Reported on 03/12/2015 01/24/15   Danella Penton, MD    REVIEW OF SYSTEMS:  Review of Systems  Constitutional: Negative for fever, chills, weight loss and malaise/fatigue.  HENT: Negative for ear pain, hearing loss and tinnitus.   Eyes: Negative for blurred vision, double vision, pain and redness.  Respiratory: Negative for cough, hemoptysis and shortness of breath.   Cardiovascular: Negative for chest pain, palpitations, orthopnea and leg swelling.  Gastrointestinal: Negative for nausea, vomiting, abdominal  pain, diarrhea and constipation.  Genitourinary: Negative for dysuria, frequency and hematuria.  Musculoskeletal: Positive for falls. Negative for back pain, joint pain and neck pain.  Skin:       No acne, rash, or lesions  Neurological: Positive for loss of consciousness (questionable). Negative for dizziness, tremors, focal weakness and weakness.  Endo/Heme/Allergies: Negative for polydipsia. Does not bruise/bleed easily.  Psychiatric/Behavioral: Negative for depression. The patient is not nervous/anxious and does not have insomnia.      VITAL SIGNS:   Filed Vitals:   03/12/15 1946 03/12/15 2017 03/12/15 2214 03/12/15 2230  BP:  127/66 111/71 118/75  Pulse:  66 70 57  Temp:      TempSrc:      Resp: 18 15 14 18   Height:  Weight:      SpO2:  97% 99% 94%   Wt Readings from Last 3 Encounters:  03/12/15 90.266 kg (199 lb)  01/20/15 90.719 kg (200 lb)  06/30/13 92.534 kg (204 lb)    PHYSICAL EXAMINATION:  Physical Exam  Vitals reviewed. Constitutional: She is oriented to person, place, and time. She appears well-developed and well-nourished. She appears distressed (Patient is emotionally distressed).  HENT:  Head: Normocephalic.  Mouth/Throat: Oropharynx is clear and moist.  Left temporal skin tear as well as left forearm skin tears  Eyes: Conjunctivae and EOM are normal. Pupils are equal, round, and reactive to light. No scleral icterus.  Neck: Normal range of motion. Neck supple. No JVD present. No thyromegaly present.  Cardiovascular: Normal rate, regular rhythm and intact distal pulses.  Exam reveals no gallop and no friction rub.   No murmur heard. Respiratory: Effort normal and breath sounds normal. No respiratory distress. She has no wheezes. She has no rales.  GI: Soft. Bowel sounds are normal. She exhibits no distension. There is no tenderness.  Musculoskeletal: Normal range of motion. She exhibits no edema.  No arthritis, no gout  Lymphadenopathy:    She has no  cervical adenopathy.  Neurological: She is alert and oriented to person, place, and time. No cranial nerve deficit.  No dysarthria, no aphasia  Skin: Skin is warm and dry. No rash noted. No erythema.  Psychiatric: Her behavior is normal.  Emotional distress, unclear if possible component of dementia    LABORATORY PANEL:   CBC  Recent Labs Lab 03/12/15 1955  WBC 9.4  HGB 11.8*  HCT 36.2  PLT 231   ------------------------------------------------------------------------------------------------------------------  Chemistries   Recent Labs Lab 03/12/15 1955  NA 143  K 4.4  CL 110  CO2 27  GLUCOSE 113*  BUN 25*  CREATININE 1.56*  CALCIUM 8.4*   ------------------------------------------------------------------------------------------------------------------  Cardiac Enzymes  Recent Labs Lab 03/12/15 1955  TROPONINI 0.23*   ------------------------------------------------------------------------------------------------------------------  RADIOLOGY:  Dg Chest 2 View  03/12/2015  CLINICAL DATA:  Status post fall while going from bed to bathroom. Concern for chest injury. Initial encounter. EXAM: CHEST  2 VIEW COMPARISON:  Chest radiograph performed 01/20/2015 FINDINGS: The lungs are well-aerated. Mild left basilar atelectasis is noted. There is no evidence of pleural effusion or pneumothorax. The heart is borderline normal in size. No acute osseous abnormalities are seen. IMPRESSION: Mild left basilar atelectasis noted. Lungs otherwise grossly clear. No displaced rib fractures identified. Electronically Signed   By: Roanna Raider M.D.   On: 03/12/2015 22:26   Ct Head Wo Contrast  03/12/2015  CLINICAL DATA:  Status post fall at nursing home. Patient on Coumadin. Concern for head injury. Skin tear at the left forehead. Initial encounter. EXAM: CT HEAD WITHOUT CONTRAST TECHNIQUE: Contiguous axial images were obtained from the base of the skull through the vertex without  intravenous contrast. COMPARISON:  None. FINDINGS: There is no evidence of acute infarction, mass lesion, or intra- or extra-axial hemorrhage on CT. Prominence of the sulci suggests mild cortical volume loss. Mild cerebellar atrophy is noted. The brainstem and fourth ventricle are within normal limits. The basal ganglia are unremarkable in appearance. The cerebral hemispheres demonstrate grossly normal gray-white differentiation. No mass effect or midline shift is seen. There is no evidence of fracture; visualized osseous structures are unremarkable in appearance. The orbits are within normal limits. The paranasal sinuses and mastoid air cells are well-aerated. Mild soft tissue injury is noted overlying the high left frontoparietal calvarium.  IMPRESSION: 1. No evidence of traumatic intracranial injury or fracture. 2. Mild soft tissue injury noted overlying the high left frontoparietal calvarium. 3. Mild cortical volume loss noted. Electronically Signed   By: Roanna Raider M.D.   On: 03/12/2015 20:48    EKG:   Orders placed or performed during the hospital encounter of 03/12/15  . ED EKG  . ED EKG    IMPRESSION AND PLAN:  Principal Problem:   Fall - patient had a fall of unknown etiology. However, with elevated troponin here we will admit her to trend her cardiac enzymes. Patient denies any symptoms of chest pain, or other typical cardiac symptoms. She does have a history of congestive heart failure, but no documented history of prior heart attack. Cardiology consult ordered Active Problems:   Elevated troponin - serial enzymes, echocardiogram, cardiology consult as above   HTN (hypertension) - normotensive here, continue home antihypertensives   Anxiety - continue home dose anxiolytics   GERD (gastroesophageal reflux disease) - equivalent home dose PPI   Hypothyroidism - home dose thyroid replacement  All the records are reviewed and case discussed with ED provider. Management plans discussed  with the patient and/or family.  DVT PROPHYLAXIS: systemic anticoagulation  ADMISSION STATUS: Observation  CODE STATUS: Full  TOTAL TIME TAKING CARE OF THIS PATIENT: 45 minutes.    Chirsty Armistead FIELDING 03/12/2015, 10:40 PM  [employer] [facility]  Office  [phone]  CC: Primary care physician; Danella Penton., MD

## 2015-03-12 NOTE — ED Notes (Addendum)
Lab called with troponin of 0.23   Dr Alphonzo Lemmings aware.

## 2015-03-12 NOTE — ED Notes (Signed)
Pt reports sob.  md aware.  Family with pt.  nsr on monitor.  Skin warm and dry.  Iv in place.  Waiting on admission.

## 2015-03-13 ENCOUNTER — Observation Stay: Payer: Medicare Other

## 2015-03-13 ENCOUNTER — Observation Stay
Admit: 2015-03-13 | Discharge: 2015-03-13 | Disposition: A | Discharge status: Home / Self Care | Payer: Medicare Other | Attending: Internal Medicine | Admitting: Internal Medicine

## 2015-03-13 DIAGNOSIS — R748 Abnormal levels of other serum enzymes: Secondary | ICD-10-CM | POA: Diagnosis not present

## 2015-03-13 LAB — TROPONIN I
TROPONIN I: 1.22 ng/mL — AB (ref <0.031)
TROPONIN I: 1.57 ng/mL — AB (ref <0.031)
Troponin I: 1.66 ng/mL — ABNORMAL HIGH (ref <0.031)

## 2015-03-13 LAB — URINALYSIS COMPLETE WITH MICROSCOPIC (ARMC ONLY)
BILIRUBIN URINE: NEGATIVE
Glucose, UA: NEGATIVE mg/dL
HGB URINE DIPSTICK: NEGATIVE
KETONES UR: NEGATIVE mg/dL
NITRITE: POSITIVE — AB
PH: 6 (ref 5.0–8.0)
PROTEIN: NEGATIVE mg/dL
Specific Gravity, Urine: 1.015 (ref 1.005–1.030)

## 2015-03-13 LAB — BASIC METABOLIC PANEL
Anion gap: 5 (ref 5–15)
BUN: 24 mg/dL — AB (ref 6–20)
CO2: 27 mmol/L (ref 22–32)
CREATININE: 1.36 mg/dL — AB (ref 0.44–1.00)
Calcium: 8.1 mg/dL — ABNORMAL LOW (ref 8.9–10.3)
Chloride: 113 mmol/L — ABNORMAL HIGH (ref 101–111)
GFR calc Af Amer: 39 mL/min — ABNORMAL LOW (ref >60)
GFR, EST NON AFRICAN AMERICAN: 33 mL/min — AB (ref >60)
Glucose, Bld: 112 mg/dL — ABNORMAL HIGH (ref 65–99)
POTASSIUM: 3.9 mmol/L (ref 3.5–5.1)
SODIUM: 145 mmol/L (ref 135–145)

## 2015-03-13 LAB — CBC
HCT: 33.2 % — ABNORMAL LOW (ref 35.0–47.0)
HEMOGLOBIN: 10.9 g/dL — AB (ref 12.0–16.0)
MCH: 31.7 pg (ref 26.0–34.0)
MCHC: 32.8 g/dL (ref 32.0–36.0)
MCV: 96.8 fL (ref 80.0–100.0)
Platelets: 195 10*3/uL (ref 150–440)
RBC: 3.42 MIL/uL — ABNORMAL LOW (ref 3.80–5.20)
RDW: 14.6 % — ABNORMAL HIGH (ref 11.5–14.5)
WBC: 8.5 10*3/uL (ref 3.6–11.0)

## 2015-03-13 LAB — MRSA PCR SCREENING: MRSA BY PCR: NEGATIVE

## 2015-03-13 LAB — HEPARIN LEVEL (UNFRACTIONATED): Heparin Unfractionated: 0.28 IU/mL — ABNORMAL LOW (ref 0.30–0.70)

## 2015-03-13 LAB — CK: CK TOTAL: 78 U/L (ref 38–234)

## 2015-03-13 MED ORDER — WARFARIN SODIUM 5 MG PO TABS
5.0000 mg | ORAL_TABLET | Freq: Every day | ORAL | Status: DC
  Administered 2015-03-13: 5 mg via ORAL
  Filled 2015-03-13: qty 1

## 2015-03-13 MED ORDER — CEPHALEXIN 250 MG PO CAPS
250.0000 mg | ORAL_CAPSULE | Freq: Three times a day (TID) | ORAL | Status: DC
  Administered 2015-03-13 – 2015-03-14 (×5): 250 mg via ORAL
  Filled 2015-03-13 (×7): qty 1

## 2015-03-13 MED ORDER — HEPARIN (PORCINE) IN NACL 100-0.45 UNIT/ML-% IJ SOLN
1000.0000 [IU]/h | INTRAMUSCULAR | Status: DC
  Administered 2015-03-13: 1000 [IU]/h via INTRAVENOUS
  Filled 2015-03-13: qty 250

## 2015-03-13 NOTE — Consult Note (Signed)
Patient is known to me I saw her 3 days ago and had x-rays taken for distal femur ORIF. X-rays here in the hospital are unchanged. She is just started weightbearing as tolerated but has been walking on the leg. Her should have a great deal of edema in the lower extremities.  On exam she is nontender to percussion at the distal femur  Impression healing distal femur fracture, continue weight-bear as tolerated  Plan she does have a follow-up appointment scheduled with me in 1 month for follow-up x-ray

## 2015-03-13 NOTE — Evaluation (Signed)
Physical Therapy Evaluation Patient Details Name: Victoria Johnston MRN: [id number] DOB: [birth year] Today's Date: 03/13/2015   History of Present Illness  Pt is an 81 y/o female that presents after an unwitnessed fall at her nursing facility. On this admission she was noted to have rising troponins, though per cardiology note these were attributed to demand ischemia. Troponins now downtrending. Patient fell and sustained a R hip fx and ORIF in late September, and has been at rehab since. She was getting to the point where she was ambulating to the bathroom independently (with RW).   Clinical Impression  Patient experienced a fall in September and sustained a R hip fx. Since that time she has been in rehab to increase her mobility independence. Patient had an unwitnessed fall at rehab yesterday ambulating to the bathroom, today she presents with decreased bed mobility secondary to trunkal weakness and deconditioning. Patient is able to transfer with RW safely and is able to ambulate without loss of balance. She does fatigue quickly with ambulation, but does not demonstrate any over loss of balance during this session. Mild weakness in bilateral LEs noted in MMT. Patient would benefit from returning to her most previous residence to continue increasing her independence with mobility.     Follow Up Recommendations SNF    Equipment Recommendations  Rolling walker with 5" wheels    Recommendations for Other Services       Precautions / Restrictions Precautions Precautions: None Restrictions Weight Bearing Restrictions: No      Mobility  Bed Mobility Overal bed mobility: Needs Assistance Bed Mobility: Supine to Sit     Supine to sit: Min assist     General bed mobility comments: Patient is able to complete rolling with hand rails and is able to transfer supine to sit with min A x1 to complete trunk mobility, she is able to bring her LEs off the edge of the bed.   Transfers Overall  transfer level: Needs assistance Equipment used: Rolling walker (2 wheeled) Transfers: Sit to/from Stand Sit to Stand: Min guard         General transfer comment: Patient transfer sit to stand with RW with appropriate hand placement, though delayed time to complete secondary to deconditioning/weakness.   Ambulation/Gait Ambulation/Gait assistance: Min guard Ambulation Distance (Feet):  (45' then rest break, then 50') Assistive device: Rolling walker (2 wheeled) Gait Pattern/deviations: Step-through pattern;Decreased step length - right;Decreased step length - left;Trunk flexed   Gait velocity interpretation: Below normal speed for age/gender General Gait Details: Patient demonstrates reciprocal gait pattern with RW and decreased step length bilaterally. No loss of balance and no buckling noted.   Stairs            Wheelchair Mobility    Modified Rankin (Stroke Patients Only)       Balance Overall balance assessment: Needs assistance Sitting-balance support: Bilateral upper extremity supported Sitting balance-Leahy Scale: Good Sitting balance - Comments: No balance deficits noted once in sitting with bilateral UE support.    Standing balance support: Bilateral upper extremity supported Standing balance-Leahy Scale: Fair Standing balance comment: No buckling or loss of balance noted during ambulation. She demonstrates appropriate RW technique.                              Pertinent Vitals/Pain Pain Assessment:  (Patient reports pain in muscles around R knee, no other complaints in this session)    Home Living Family/patient expects to  be discharged to:: Skilled nursing facility                      Prior Function Level of Independence: Independent with assistive device(s)         Comments: Patient had been ambulating with a RW recently      Hand Dominance        Extremity/Trunk Assessment   Upper Extremity Assessment: Overall WFL for  tasks assessed           Lower Extremity Assessment: Overall WFL for tasks assessed (Both her LEs and UEs are below expectation but are able to complete against gravity.)         Communication   Communication: HOH  Cognition Arousal/Alertness: Awake/alert Behavior During Therapy: WFL for tasks assessed/performed Overall Cognitive Status: Within Functional Limits for tasks assessed                      General Comments General comments (skin integrity, edema, etc.): Abrasion to L forehead.    Exercises        Assessment/Plan    PT Assessment Patient needs continued PT services  PT Diagnosis Difficulty walking;Abnormality of gait;Generalized weakness   PT Problem List Decreased strength;Decreased knowledge of use of DME;Decreased safety awareness;Decreased activity tolerance;Decreased balance;Decreased mobility  PT Treatment Interventions DME instruction;Gait training;Stair training;Therapeutic activities;Therapeutic exercise;Balance training   PT Goals (Current goals can be found in the Care Plan section) Acute Rehab PT Goals Patient Stated Goal: To return to [facility].  PT Goal Formulation: With patient Time For Goal Achievement: 03/27/15 Potential to Achieve Goals: Good    Frequency Min 2X/week   Barriers to discharge Inaccessible home environment      Co-evaluation               End of Session Equipment Utilized During Treatment: Gait belt Activity Tolerance: Patient tolerated treatment well;Patient limited by fatigue Patient left: in chair;with call bell/phone within reach;with chair alarm set;with family/visitor present Nurse Communication: Mobility status    Functional Assessment Tool Used: Clinical judgement  Functional Limitation: Mobility: Walking and moving around Mobility: Walking and Moving Around Current Status [phone]): At least 40 percent but less than 60 percent impaired, limited or restricted Mobility: Walking and Moving Around  Goal Status [phone]): At least 40 percent but less than 60 percent impaired, limited or restricted    Time: 1359-1425 PT Time Calculation (min) (ACUTE ONLY): 26 min   Charges:   PT Evaluation $Initial PT Evaluation Tier I: 1 Procedure     PT G Codes:   PT G-Codes **NOT FOR INPATIENT CLASS** Functional Assessment Tool Used: Clinical judgement  Functional Limitation: Mobility: Walking and moving around Mobility: Walking and Moving Around Current Status ([id number]): At least 40 percent but less than 60 percent impaired, limited or restricted Mobility: Walking and Moving Around Goal Status [phone]): At least 40 percent but less than 60 percent impaired, limited or restricted   Kerin Ransom, PT, DPT    03/13/2015, 3:13 PM

## 2015-03-13 NOTE — Progress Notes (Signed)
*  PRELIMINARY RESULTS* Echocardiogram 2D Echocardiogram has been performed.  Victoria Johnston 03/13/2015, 3:18 PM

## 2015-03-13 NOTE — Progress Notes (Signed)
[facility] [place] [facility]   PATIENT NAME: Victoria Johnston    MR#:  [id number]  DATE OF BIRTH:  Mar 14, 1924  SUBJECTIVE:  CHIEF COMPLAINT:   Chief Complaint  Patient presents with  . Fall  . Laceration   patient is a 81 year old Caucasian female with past medical history significant for history of recent distal right femur fracture status post surgery, but Dr. Rosita Kea who presents to the hospital after fall. In the hospital, she was noted to have renal insufficiency. Elevated troponin. She had no chest pain. She was seen by cardiologist who recommended echocardiogram, but no further interventional studies. Patient admits of having some swelling in the right lower extremity and discomfort. Her pro time is therapeutic. Analysis revealed pyuria. Urine cultures are pending,  now on the Rocephin  Review of Systems  Constitutional: Negative for fever, chills and weight loss.  HENT: Negative for congestion.   Eyes: Negative for blurred vision and double vision.  Respiratory: Negative for cough, sputum production, shortness of breath and wheezing.   Cardiovascular: Negative for chest pain, palpitations, orthopnea, leg swelling and PND.  Gastrointestinal: Negative for nausea, vomiting, abdominal pain, diarrhea, constipation and blood in stool.  Genitourinary: Negative for dysuria, urgency, frequency and hematuria.  Musculoskeletal: Negative for falls.  Neurological: Negative for dizziness, tremors, focal weakness and headaches.  Endo/Heme/Allergies: Does not bruise/bleed easily.  Psychiatric/Behavioral: Negative for depression. The patient does not have insomnia.     VITAL SIGNS: Blood pressure 102/43, pulse 75, temperature 97.6 F (36.4 C), temperature source Oral, resp. rate 18, height  (1.651 m), weight 87.454 kg (192 lb 12.8 oz), SpO2 95 %.  PHYSICAL EXAMINATION:   GENERAL:  81 y.o.-year-old patient lying in the bed with no acute distress.  EYES:  Pupils equal, round, reactive to light and accommodation. No scleral icterus. Extraocular muscles intact.  HEENT: Head atraumatic, normocephalic. Oropharynx and nasopharynx clear.  NECK:  Supple, no jugular venous distention. No thyroid enlargement, no tenderness.  LUNGS: Normal breath sounds bilaterally, no wheezing, rales,rhonchi or crepitation. No use of accessory muscles of respiration.  CARDIOVASCULAR: S1, S2 normal. No murmurs, rubs, or gallops.  ABDOMEN: Soft, nontender, nondistended. Bowel sounds present. No organomegaly or mass.  EXTREMITIES: No pedal edema, cyanosis, or clubbing.  NEUROLOGIC: Cranial nerves II through XII are intact. Muscle strength 5/5 in all extremities. Sensation intact. Gait not checked.  PSYCHIATRIC: The patient is alert and oriented x 3.  SKIN: No obvious rash, lesion, or ulcer. Mild increase of warmth in the right lower extremity distal femur with swelling in the area, point of tenderness was noted around the scar, no fluctuation  ORDERS/RESULTS REVIEWED:   CBC  Recent Labs Lab 03/12/15 1955 03/13/15 0615  WBC 9.4 8.5  HGB 11.8* 10.9*  HCT 36.2 33.2*  PLT 231 195  MCV 96.7 96.8  MCH 31.5 31.7  MCHC 32.6 32.8  RDW 14.5 14.6*  LYMPHSABS 2.5  --   MONOABS 0.6  --   EOSABS 0.2  --   BASOSABS 0.1  --    ------------------------------------------------------------------------------------------------------------------  Chemistries   Recent Labs Lab 03/12/15 1955 03/13/15 0615  NA 143 145  K 4.4 3.9  CL 110 113*  CO2 27 27  GLUCOSE 113* 112*  BUN 25* 24*  CREATININE 1.56* 1.36*  CALCIUM 8.4* 8.1*   ------------------------------------------------------------------------------------------------------------------ estimated creatinine clearance is 30.6 mL/min (by C-G formula based on Cr of 1.36). ------------------------------------------------------------------------------------------------------------------ No results for input(s): TSH,  T4TOTAL, T3FREE, THYROIDAB in the last  72 hours.  Invalid input(s): FREET3  Cardiac Enzymes  Recent Labs Lab 03/13/15 0012 03/13/15 0615 03/13/15 1210  TROPONINI 1.57* 1.66* 1.22*   ------------------------------------------------------------------------------------------------------------------ Invalid input(s): POCBNP ---------------------------------------------------------------------------------------------------------------  RADIOLOGY: Dg Chest 2 View  03/12/2015  CLINICAL DATA:  Status post fall while going from bed to bathroom. Concern for chest injury. Initial encounter. EXAM: CHEST  2 VIEW COMPARISON:  Chest radiograph performed 01/20/2015 FINDINGS: The lungs are well-aerated. Mild left basilar atelectasis is noted. There is no evidence of pleural effusion or pneumothorax. The heart is borderline normal in size. No acute osseous abnormalities are seen. IMPRESSION: Mild left basilar atelectasis noted. Lungs otherwise grossly clear. No displaced rib fractures identified. Electronically Signed   By: Roanna Raider M.D.   On: 03/12/2015 22:26   Ct Head Wo Contrast  03/12/2015  CLINICAL DATA:  Status post fall at nursing home. Patient on Coumadin. Concern for head injury. Skin tear at the left forehead. Initial encounter. EXAM: CT HEAD WITHOUT CONTRAST TECHNIQUE: Contiguous axial images were obtained from the base of the skull through the vertex without intravenous contrast. COMPARISON:  None. FINDINGS: There is no evidence of acute infarction, mass lesion, or intra- or extra-axial hemorrhage on CT. Prominence of the sulci suggests mild cortical volume loss. Mild cerebellar atrophy is noted. The brainstem and fourth ventricle are within normal limits. The basal ganglia are unremarkable in appearance. The cerebral hemispheres demonstrate grossly normal gray-white differentiation. No mass effect or midline shift is seen. There is no evidence of fracture; visualized osseous structures are  unremarkable in appearance. The orbits are within normal limits. The paranasal sinuses and mastoid air cells are well-aerated. Mild soft tissue injury is noted overlying the high left frontoparietal calvarium. IMPRESSION: 1. No evidence of traumatic intracranial injury or fracture. 2. Mild soft tissue injury noted overlying the high left frontoparietal calvarium. 3. Mild cortical volume loss noted. Electronically Signed   By: Roanna Raider M.D.   On: 03/12/2015 20:48    EKG:  Orders placed or performed during the hospital encounter of 03/12/15  . ED EKG  . ED EKG    ASSESSMENT AND PLAN:  Principal Problem:   Fall Active Problems:   Elevated troponin   Anxiety   GERD (gastroesophageal reflux disease)   HTN (hypertension)   Hypothyroidism 1. Elevated troponin, likely demand ischemia per cardiology, echocardiogram is performed further testing per cardiology 2. Right lower extremity pain, get an x-ray and ask Dr. Rosita Kea to see patient in consultation, doubt DVT as patient is fully anticoagulated with Coumadin 3. Acute cystitis without hematuria, urine culture is taken, continue Rocephin 4. Fall, get physical therapist involved   Management plans discussed with the patient, family and they are in agreement.   DRUG ALLERGIES:  Allergies  Allergen Reactions  . Codeine Nausea Only  . Lexapro [Escitalopram Oxalate]   . Lyrica [Pregabalin]   . Mysoline [Primidone]     CODE STATUS:     Code Status Orders        Start     Ordered   03/12/15 2351  Full code   Continuous     03/12/15 2350      TOTAL TIME TAKING CARE OF THIS PATIENT: 40 minutes.    Katharina Caper M.D on 03/13/2015 at 4:30 PM  Between 7am to 6pm - Pager - [phone]  After 6pm go to [email or web address] - password [email or web address] [facility]  [facility] [place] [facility]  Office  [phone]  CC: Primary care physician; Danella Penton., MD

## 2015-03-13 NOTE — Progress Notes (Signed)
Pt is alert and oriented to place. Pt's O2 sats were between 79-82 when she arrived to unit, Pt was placed on 2L of oxygen initially but O2 sats remained in the 85-88, Oxygen was increased to 4L, Pt's O2 sats increased to 93-100 on 4 liters. MD Anne Hahn aware.  Skin assessment- Bilateral arm and left big toe ecchymosis noted,  There is rash to left abd. folds and edema to LE. There is an abration to  Left side of Forehead (cleansed) and left  Arm (dressing in place) noted as well.  Verified by Serina Cowper, RN.

## 2015-03-13 NOTE — Progress Notes (Signed)
Victoria Johnston is a 81 y.o. female  [id number]  Primary Cardiologist: Adrian Blackwater Reason for Consultation: Status post fall  HPI: 81 year old white female who apparently states that she tripped and fell. She denies any chest pain or shortness of breath but had mildly elevated troponin thus I was asked to evaluate the patient's for possible syncope.   Review of Systems: No chest pain no shortness of breath or orthopnea or leg swelling.   Past Medical History  Diagnosis Date  . Swelling   . Hearing loss   . Difficulty breathing   . Bruises easily   . Tremors of nervous system   . HLD (hyperlipidemia)   . Crohn disease (HCC)   . GERD (gastroesophageal reflux disease)   . Raynaud disease   . HTN (hypertension)   . OA (osteoarthritis)   . Chronic obstructive asthma (HCC)   . CHF (congestive heart failure) (HCC)   . DVT (deep vein thrombosis) in pregnancy   . Anxiety     Medications Prior to Admission  Medication Sig Dispense Refill  . cholecalciferol (VITAMIN D) 1000 UNITS tablet Take 4,000 Units by mouth daily.    Marland Kitchen docusate sodium (COLACE) 100 MG capsule Take 1 capsule (100 mg total) by mouth 2 (two) times daily. 10 capsule 0  . folic acid (FOLVITE) 1 MG tablet Take 1 mg by mouth daily.    . haloperidol (HALDOL) 1 MG tablet Take 1 tablet (1 mg total) by mouth at bedtime. 1 tablet 1  . Iron-Vitamin C (VITRON-C) 65-125 MG TABS Take 1 tablet by mouth daily.    Marland Kitchen levothyroxine (SYNTHROID, LEVOTHROID) 75 MCG tablet Take 75 mcg by mouth daily before breakfast.    . LORazepam (ATIVAN) 1 MG tablet Take 1 tablet (1 mg total) by mouth every 8 (eight) hours as needed for anxiety. 30 tablet 0  . magnesium oxide (MAG-OX) 400 MG tablet Take 400 mg by mouth daily.    . nitroGLYCERIN (NITROSTAT) 0.4 MG SL tablet Place 0.4 mg under the tongue every 5 (five) minutes as needed for chest pain.    Marland Kitchen omeprazole (PRILOSEC) 20 MG capsule Take 20 mg by mouth daily.    Marland Kitchen oxyCODONE (OXY  IR/ROXICODONE) 5 MG immediate release tablet Take 1-2 tablets (5-10 mg total) by mouth every 4 (four) hours as needed for breakthrough pain ((for MODERATE breakthrough pain)). 30 tablet 0  . potassium chloride SA (K-DUR,KLOR-CON) 20 MEQ tablet Take 20 mEq by mouth 2 (two) times daily.    . propranolol (INDERAL) 40 MG tablet Take 40 mg by mouth daily.    Marland Kitchen senna (SENOKOT) 8.6 MG TABS tablet Take 1 tablet (8.6 mg total) by mouth 2 (two) times daily. 120 each 0  . topiramate (TOPAMAX) 100 MG tablet Take 50 mg by mouth 2 (two) times daily.    Marland Kitchen torsemide (DEMADEX) 20 MG tablet Take 30 mg by mouth daily.    . vitamin B-12 (CYANOCOBALAMIN) 1000 MCG tablet Take 1,000 mcg by mouth daily at 12 noon.    . warfarin (COUMADIN) 5 MG tablet Take 5 mg by mouth at bedtime.    Marland Kitchen acetaminophen (TYLENOL) 325 MG tablet Take 2 tablets (650 mg total) by mouth every 6 (six) hours as needed for mild pain (or Fever >/= 101). (Patient not taking: Reported on 03/12/2015) 1 tablet 1  . bisacodyl (DULCOLAX) 10 MG suppository Place 1 suppository (10 mg total) rectally daily as needed for moderate constipation. (Patient not taking: Reported on 03/12/2015)  12 suppository 0  . minocycline (MINOCIN,DYNACIN) 100 MG capsule Take 1 capsule (100 mg total) by mouth 2 (two) times daily. (Patient not taking: Reported on 03/12/2015) 14 capsule 0     . cephALEXin  250 mg Oral 3 times per day  . haloperidol  1 mg Oral QHS  . levothyroxine  75 mcg Oral QAC breakfast  . pantoprazole  40 mg Oral QAC breakfast  . sodium chloride  3 mL Intravenous Q12H  . topiramate  50 mg Oral BID    Infusions: . heparin 1,000 Units/hr (03/13/15 0455)    Allergies  Allergen Reactions  . Codeine Nausea Only  . Lexapro [Escitalopram Oxalate]   . Lyrica [Pregabalin]   . Mysoline [Primidone]     Social History   Social History  . Marital Status: Widowed    Spouse Name: N/A  . Number of Children: N/A  . Years of Education: N/A   Occupational  History  . Not on file.   Social History Main Topics  . Smoking status: Never Smoker   . Smokeless tobacco: Never Used  . Alcohol Use: No  . Drug Use: No  . Sexual Activity: Not on file   Other Topics Concern  . Not on file   Social History Narrative    Family History  Problem Relation Age of Onset  . CAD    . Diabetes    . Hypertension      PHYSICAL EXAM: Filed Vitals:   03/13/15 0825  BP: 115/65  Pulse: 85  Temp: 97.6 F (36.4 C)  Resp: 17     Intake/Output Summary (Last 24 hours) at 03/13/15 0926 Last data filed at 03/13/15 0205  Gross per 24 hour  Intake      0 ml  Output     50 ml  Net    -50 ml    General:  Well appearing. No respiratory difficulty HEENT: normal Neck: supple. no JVD. Carotids 2+ bilat; no bruits. No lymphadenopathy or thryomegaly appreciated. Cor: PMI nondisplaced. Regular rate & rhythm. No rubs, gallops or murmurs. Lungs: clear Abdomen: soft, nontender, nondistended. No hepatosplenomegaly. No bruits or masses. Good bowel sounds. Extremities: no cyanosis, clubbing, rash, edema Neuro: alert & oriented x 3, cranial nerves grossly intact. moves all 4 extremities w/o difficulty. Affect pleasant.  ECG: Sinus rhythm  Results for orders placed or performed during the hospital encounter of 03/12/15 (from the past 24 hour(s))  CBC with Differential     Status: Abnormal   Collection Time: 03/12/15  7:55 PM  Result Value Ref Range   WBC 9.4 3.6 - 11.0 K/uL   RBC 3.74 (L) 3.80 - 5.20 MIL/uL   Hemoglobin 11.8 (L) 12.0 - 16.0 g/dL   HCT 16.1 09.6 - 04.5 %   MCV 96.7 80.0 - 100.0 fL   MCH 31.5 26.0 - 34.0 pg   MCHC 32.6 32.0 - 36.0 g/dL   RDW 40.9 81.1 - 91.4 %   Platelets 231 150 - 440 K/uL   Neutrophils Relative % 63 %   Neutro Abs 6.0 1.4 - 6.5 K/uL   Lymphocytes Relative 27 %   Lymphs Abs 2.5 1.0 - 3.6 K/uL   Monocytes Relative 6 %   Monocytes Absolute 0.6 0.2 - 0.9 K/uL   Eosinophils Relative 3 %   Eosinophils Absolute 0.2 0 - 0.7  K/uL   Basophils Relative 1 %   Basophils Absolute 0.1 0 - 0.1 K/uL  Troponin I     Status: Abnormal  Collection Time: 03/12/15  7:55 PM  Result Value Ref Range   Troponin I 0.23 (H) <0.031 ng/mL  Basic metabolic panel     Status: Abnormal   Collection Time: 03/12/15  7:55 PM  Result Value Ref Range   Sodium 143 135 - 145 mmol/L   Potassium 4.4 3.5 - 5.1 mmol/L   Chloride 110 101 - 111 mmol/L   CO2 27 22 - 32 mmol/L   Glucose, Bld 113 (H) 65 - 99 mg/dL   BUN 25 (H) 6 - 20 mg/dL   Creatinine, Ser 4.91 (H) 0.44 - 1.00 mg/dL   Calcium 8.4 (L) 8.9 - 10.3 mg/dL   GFR calc non Af Amer 28 (L) >60 mL/min   GFR calc Af Amer 33 (L) >60 mL/min   Anion gap 6 5 - 15  Protime-INR     Status: Abnormal   Collection Time: 03/12/15  7:55 PM  Result Value Ref Range   Prothrombin Time 24.4 (H) 11.4 - 15.0 seconds   INR 2.22   Troponin I     Status: Abnormal   Collection Time: 03/13/15 12:12 AM  Result Value Ref Range   Troponin I 1.57 (H) <0.031 ng/mL  MRSA PCR Screening     Status: None   Collection Time: 03/13/15 12:41 AM  Result Value Ref Range   MRSA by PCR NEGATIVE NEGATIVE  Urinalysis complete, with microscopic ([facility] only)     Status: Abnormal   Collection Time: 03/13/15  5:09 AM  Result Value Ref Range   Color, Urine YELLOW (A) YELLOW   APPearance HAZY (A) CLEAR   Glucose, UA NEGATIVE NEGATIVE mg/dL   Bilirubin Urine NEGATIVE NEGATIVE   Ketones, ur NEGATIVE NEGATIVE mg/dL   Specific Gravity, Urine 1.015 1.005 - 1.030   Hgb urine dipstick NEGATIVE NEGATIVE   pH 6.0 5.0 - 8.0   Protein, ur NEGATIVE NEGATIVE mg/dL   Nitrite POSITIVE (A) NEGATIVE   Leukocytes, UA 3+ (A) NEGATIVE   RBC / HPF 0-5 0 - 5 RBC/hpf   WBC, UA 6-30 0 - 5 WBC/hpf   Bacteria, UA FEW (A) NONE SEEN   Squamous Epithelial / LPF 0-5 (A) NONE SEEN   Mucous PRESENT    Hyaline Casts, UA PRESENT   Troponin I     Status: Abnormal   Collection Time: 03/13/15  6:15 AM  Result Value Ref Range   Troponin I 1.66 (H)  <0.031 ng/mL  Basic metabolic panel     Status: Abnormal   Collection Time: 03/13/15  6:15 AM  Result Value Ref Range   Sodium 145 135 - 145 mmol/L   Potassium 3.9 3.5 - 5.1 mmol/L   Chloride 113 (H) 101 - 111 mmol/L   CO2 27 22 - 32 mmol/L   Glucose, Bld 112 (H) 65 - 99 mg/dL   BUN 24 (H) 6 - 20 mg/dL   Creatinine, Ser 7.91 (H) 0.44 - 1.00 mg/dL   Calcium 8.1 (L) 8.9 - 10.3 mg/dL   GFR calc non Af Amer 33 (L) >60 mL/min   GFR calc Af Amer 39 (L) >60 mL/min   Anion gap 5 5 - 15  CBC     Status: Abnormal   Collection Time: 03/13/15  6:15 AM  Result Value Ref Range   WBC 8.5 3.6 - 11.0 K/uL   RBC 3.42 (L) 3.80 - 5.20 MIL/uL   Hemoglobin 10.9 (L) 12.0 - 16.0 g/dL   HCT 50.5 (L) 69.7 - 94.8 %   MCV 96.8  80.0 - 100.0 fL   MCH 31.7 26.0 - 34.0 pg   MCHC 32.8 32.0 - 36.0 g/dL   RDW 16.1 (H) 09.6 - 04.5 %   Platelets 195 150 - 440 K/uL   Dg Chest 2 View  03/12/2015  CLINICAL DATA:  Status post fall while going from bed to bathroom. Concern for chest injury. Initial encounter. EXAM: CHEST  2 VIEW COMPARISON:  Chest radiograph performed 01/20/2015 FINDINGS: The lungs are well-aerated. Mild left basilar atelectasis is noted. There is no evidence of pleural effusion or pneumothorax. The heart is borderline normal in size. No acute osseous abnormalities are seen. IMPRESSION: Mild left basilar atelectasis noted. Lungs otherwise grossly clear. No displaced rib fractures identified. Electronically Signed   By: Roanna Raider M.D.   On: 03/12/2015 22:26   Ct Head Wo Contrast  03/12/2015  CLINICAL DATA:  Status post fall at nursing home. Patient on Coumadin. Concern for head injury. Skin tear at the left forehead. Initial encounter. EXAM: CT HEAD WITHOUT CONTRAST TECHNIQUE: Contiguous axial images were obtained from the base of the skull through the vertex without intravenous contrast. COMPARISON:  None. FINDINGS: There is no evidence of acute infarction, mass lesion, or intra- or extra-axial  hemorrhage on CT. Prominence of the sulci suggests mild cortical volume loss. Mild cerebellar atrophy is noted. The brainstem and fourth ventricle are within normal limits. The basal ganglia are unremarkable in appearance. The cerebral hemispheres demonstrate grossly normal gray-white differentiation. No mass effect or midline shift is seen. There is no evidence of fracture; visualized osseous structures are unremarkable in appearance. The orbits are within normal limits. The paranasal sinuses and mastoid air cells are well-aerated. Mild soft tissue injury is noted overlying the high left frontoparietal calvarium. IMPRESSION: 1. No evidence of traumatic intracranial injury or fracture. 2. Mild soft tissue injury noted overlying the high left frontoparietal calvarium. 3. Mild cortical volume loss noted. Electronically Signed   By: Roanna Raider M.D.   On: 03/12/2015 20:48     ASSESSMENT AND PLAN: Mildly elevated troponin due to most likely demand ischemia, we will locate echocardiogram for wall motion abnormalities. But patient should be treated conservatively given her age and given the fact that she is not having any chest pain.  Makayleigh Poliquin A

## 2015-03-13 NOTE — Care Management Obs Status (Signed)
MEDICARE OBSERVATION STATUS NOTIFICATION   Patient Details  Name: Victoria Johnston MRN: [id number] Date of Birth: 10-13-1923   Medicare Observation Status Notification Given:  Yes    Chapman Fitch, RN 03/13/2015, 9:27 AM

## 2015-03-13 NOTE — Progress Notes (Signed)
ANTICOAGULATION CONSULT NOTE - Initial Consult  Pharmacy Consult for heparin Indication: chest pain/ACS  Allergies  Allergen Reactions  . Codeine Nausea Only  . Lexapro [Escitalopram Oxalate]   . Lyrica [Pregabalin]   . Mysoline [Primidone]     Patient Measurements: Height: 5\' 5"  (165.1 cm) Weight: 192 lb 6.4 oz (87.272 kg) IBW/kg (Calculated) : 57 Heparin Dosing Weight: 77 kg  Vital Signs: Temp: 97.5 F (36.4 C) (11/13 2344) Temp Source: Oral (11/13 2344) BP: 130/73 mmHg (11/13 2344) Pulse Rate: 75 (11/13 2344)  Labs:  Recent Labs  03/12/15 1955 03/13/15 0012  HGB 11.8*  --   HCT 36.2  --   PLT 231  --   LABPROT 24.4*  --   INR 2.22  --   CREATININE 1.56*  --   TROPONINI 0.23* 1.57*    Estimated Creatinine Clearance: 26.7 mL/min (by C-G formula based on Cr of 1.56).   Medical History: Past Medical History  Diagnosis Date  . Swelling   . Hearing loss   . Difficulty breathing   . Bruises easily   . Tremors of nervous system   . HLD (hyperlipidemia)   . Crohn disease (HCC)   . GERD (gastroesophageal reflux disease)   . Raynaud disease   . HTN (hypertension)   . OA (osteoarthritis)   . Chronic obstructive asthma (HCC)   . CHF (congestive heart failure) (HCC)   . DVT (deep vein thrombosis) in pregnancy   . Anxiety     Medications:  Infusions:  . heparin      Assessment: 89 yof cc fall/laceration with elevated troponin. Starting UFH for possible syncopal episodes r/o ACS. INR is therapeutic, 2.2, will hold VKA while on UFH and will not bolus.   Goal of Therapy:  Heparin level 0.3-0.7 units/ml Monitor platelets by anticoagulation protocol: Yes   Plan:  No bolus, INR therapeutic Start heparin infusion at 1000 units/hr Check anti-Xa level in 8 hours and daily while on heparin Continue to monitor H&H and platelets  Victoria Johnston, Pharm.D.  Clinical Pharmacist 03/13/2015,3:11 AM

## 2015-03-13 NOTE — Plan of Care (Signed)
Problem: Education: Goal: Knowledge of [facility] Education information/materials will improve Outcome: Not Progressing Pt forgetful, hx dementia, reorient as needed  Problem: Safety: Goal: Ability to remain free from injury will improve Outcome: Progressing Fall precautions in place  Problem: Pain Managment: Goal: General experience of comfort will improve Outcome: Progressing No complaints of pain this am

## 2015-03-13 NOTE — Progress Notes (Addendum)
MD, Anne Hahn aware of new troponin results and patient's change in status. Pt is insulting and punching at staff, removed IV access, she's incontinent of Bowel an bladder at this time, according to pt's daughter, pt is baseline continent of bladder and bowel. Pt pulling off telemetry box. Will bladder scan  Pt and collect a urine specimen via In & out cath to sent to lab per doctor's order.

## 2015-03-13 NOTE — Clinical Social Work Note (Signed)
Clinical Social Work Assessment  Patient Details  Name: Victoria Johnston MRN: [id number] Date of Birth: February 08, 1924  Date of referral:  03/13/15               Reason for consult:  Facility Placement                Permission sought to share information with:  Facility Medical sales representative, Family Supports Permission granted to share information::  Yes, Verbal Permission Granted  Name::        Agency::     Relationship::     Contact Information:     Housing/Transportation Living arrangements for the past 2 months:  Skilled Building surveyor of Information:  Patient (sibling) Patient Interpreter Needed:  None Criminal Activity/Legal Involvement Pertinent to Current Situation/Hospitalization:  No - Comment as needed Significant Relationships:  Adult Children Lives with:  Siblings Do you feel safe going back to the place where you live?  Yes Need for family participation in patient care:  Yes (Comment)  Care giving concerns:  [facility]   Social Worker assessment / plan:  CSW spoke with patient in her hospital room as well as patient's sister who was also present. Patient was pleasantly confused but stated that she does lives at [facility] and she wishes to return there soon. Patient was about to get an echo. Patient's sister stated she is pleased with how patient is doing. Will facilitate patient back to [facility] when time.   Employment status:  Retired Health and safety inspector:  Medicare PT Recommendations:  Not assessed at this time Information / Referral to community resources:     Patient/Family's Response to care:  Patient very pleasant and smiling.  Patient/Family's Understanding of and Emotional Response to Diagnosis, Current Treatment, and Prognosis:  Patient is aware she lives at [place] and is here at [facility] currently but it is unclear with her dementia if she understands.  Emotional Assessment Appearance:  Appears stated  age Attitude/Demeanor/Rapport:   (pleasant and cooperative) Affect (typically observed):  Accepting, Calm Orientation:  Oriented to Self, Oriented to Place Alcohol / Substance use:  Not Applicable Psych involvement (Current and /or in the community):  No (Comment)  Discharge Needs  Concerns to be addressed:  Care Coordination Readmission within the last 30 days:  No Current discharge risk:  None Barriers to Discharge:  No Barriers Identified   York Spaniel, LCSW 03/13/2015, 2:55 PM

## 2015-03-13 NOTE — Care Management (Signed)
Patient admitted status post fall from [facility].  CSW consult placed.

## 2015-03-14 DIAGNOSIS — M79604 Pain in right leg: Secondary | ICD-10-CM

## 2015-03-14 DIAGNOSIS — N3000 Acute cystitis without hematuria: Secondary | ICD-10-CM

## 2015-03-14 DIAGNOSIS — R748 Abnormal levels of other serum enzymes: Secondary | ICD-10-CM | POA: Diagnosis not present

## 2015-03-14 LAB — BASIC METABOLIC PANEL
ANION GAP: 5 (ref 5–15)
BUN: 21 mg/dL — ABNORMAL HIGH (ref 6–20)
CALCIUM: 8.4 mg/dL — AB (ref 8.9–10.3)
CO2: 25 mmol/L (ref 22–32)
Chloride: 113 mmol/L — ABNORMAL HIGH (ref 101–111)
Creatinine, Ser: 1.19 mg/dL — ABNORMAL HIGH (ref 0.44–1.00)
GFR calc non Af Amer: 39 mL/min — ABNORMAL LOW (ref >60)
GFR, EST AFRICAN AMERICAN: 46 mL/min — AB (ref >60)
GLUCOSE: 103 mg/dL — AB (ref 65–99)
POTASSIUM: 4.2 mmol/L (ref 3.5–5.1)
Sodium: 143 mmol/L (ref 135–145)

## 2015-03-14 LAB — CBC
HEMATOCRIT: 33.5 % — AB (ref 35.0–47.0)
HEMOGLOBIN: 11.2 g/dL — AB (ref 12.0–16.0)
MCH: 32.1 pg (ref 26.0–34.0)
MCHC: 33.2 g/dL (ref 32.0–36.0)
MCV: 96.5 fL (ref 80.0–100.0)
Platelets: 175 10*3/uL (ref 150–440)
RBC: 3.48 MIL/uL — AB (ref 3.80–5.20)
RDW: 14.5 % (ref 11.5–14.5)
WBC: 6.2 10*3/uL (ref 3.6–11.0)

## 2015-03-14 MED ORDER — CEPHALEXIN 250 MG PO CAPS: 250.0000 mg | ORAL_CAPSULE | Freq: Three times a day (TID) | ORAL | Status: DC

## 2015-03-14 NOTE — Progress Notes (Signed)
Called report to Abby at 1425 at [facility]. Waiting on pt's daughter to arrive for transport. Will continue to assess.

## 2015-03-14 NOTE — Clinical Social Work Note (Signed)
Patient's grandson was in patient's room and when CSW came into the room he was sleeping as he works night shift. Patient's grandson stated that he would call his mom (patient's daughter) to inform her and to ask how she wanted patient to transport. CSW revisited room a little while later to follow up with status and he stated his mom was on her way to the hospital. CSW then revisited room again when patient's daughter arrived and she informed CSW that she wished for transport via EMS. CSW informed patient's daughter that "high fall risk" and "altered mental status" would be what was put on the EMS form and that it should be covered by insurance but there are no guarantees. York Spaniel MSW,LCSW [phone]

## 2015-03-14 NOTE — Discharge Summary (Signed)
[facility] - [place] at [facility]   PATIENT NAME: Victoria Johnston    MR#:  [id number]  DATE OF BIRTH:  07/04/1923  DATE OF ADMISSION:  03/12/2015 ADMITTING PHYSICIAN: Oralia Manis, MD  DATE OF DISCHARGE: No discharge date for patient encounter.  PRIMARY CARE PHYSICIAN: Danella Penton., MD     ADMISSION DIAGNOSIS:  Laceration [T14.8] Cardiac enzymes elevated [R74.8] Closed head injury, initial encounter [S09.90XA]  DISCHARGE DIAGNOSIS:  Principal Problem:   Fall Active Problems:   Elevated troponin   Acute cystitis without hematuria   Right leg pain   Anxiety   GERD (gastroesophageal reflux disease)   HTN (hypertension)   Hypothyroidism   SECONDARY DIAGNOSIS:   Past Medical History  Diagnosis Date  . Swelling   . Hearing loss   . Difficulty breathing   . Bruises easily   . Tremors of nervous system   . HLD (hyperlipidemia)   . Crohn disease (HCC)   . GERD (gastroesophageal reflux disease)   . Raynaud disease   . HTN (hypertension)   . OA (osteoarthritis)   . Chronic obstructive asthma (HCC)   . CHF (congestive heart failure) (HCC)   . DVT (deep vein thrombosis) in pregnancy   . Anxiety     .pro HOSPITAL COURSE:  Patient is a 81 year old Caucasian female with past medical history significant for history of recent right distal femur fracture status post ORIF by Dr. Rosita Kea who was just recently started on weightbearing as tolerated , who came to the hospital after fall in the facility. On arrival, she was complaining of right leg pain. Her labs revealed pyuria, concerning for urinary tract infection. Urine cultures were taken and they're growing Escherichia coli, sensitivities pending. Patient was initiated on the Rocephin and her condition overall improved. She was evaluated by physical therapist as well as Dr. Rosita Kea and felt that patient should continue weightbearing as tolerated and follow-up with orthopedist surgeon as outpatient. On  arrival to the hospital patient was also noted to have mild elevation of troponin,. She however had no chest pain. Patient was seen by cardiologist and felt that conservative management should be continued. Echocardiogram was performed and it revealed normal ejection fraction of 50%, diastolic dysfunction, mild mitral regurgitation. Patient was advised to follow up with cardiologist as outpatient Discussion by problem 1. Elevated troponin, likely demand ischemia per cardiology, echocardiogram was unremarkable, she is to follow up with cardiologist as outpatient. She is to continue propranolol  2. Right lower extremity pain, distal femur x-ray showed status post ORIF of distal right femoral metadiaphyseal fracture with stable alignment,  patient is to follow-up with orthopedist surgeon as outpatient and continue weightbearing as tolerated in skilled nursing facility 3. Acute cystitis without hematuria due to Escherichia coli, sensitivities pending, however, patient improved clinically. Continue Keflex orally for 5 more days to complete course 4. Fall, physical therapy to be continued 5. Essential hypertension, well-controlled 6. Hypothyroidism, she is to continue usual supplementation 7. Acute on chronic renal insufficiency, improved with IV fluid administration, follow clinically and lab wise, since patient is on diuretic and may be prone to dehydration  DISCHARGE CONDITIONS:   Stable  CONSULTS OBTAINED:  Treatment Team:  Laurier Nancy, MD Kennedy Bucker, MD  DRUG ALLERGIES:   Allergies  Allergen Reactions  . Codeine Nausea Only  . Lexapro [Escitalopram Oxalate]   . Lyrica [Pregabalin]   . Mysoline [Primidone]     DISCHARGE MEDICATIONS:   Current Discharge Medication List    START  taking these medications   Details  cephALEXin (KEFLEX) 250 MG capsule Take 1 capsule (250 mg total) by mouth every 8 (eight) hours. Qty: 15 capsule, Refills: 0      CONTINUE these medications which  have NOT CHANGED   Details  cholecalciferol (VITAMIN D) 1000 UNITS tablet Take 4,000 Units by mouth daily.    docusate sodium (COLACE) 100 MG capsule Take 1 capsule (100 mg total) by mouth 2 (two) times daily. Qty: 10 capsule, Refills: 0    folic acid (FOLVITE) 1 MG tablet Take 1 mg by mouth daily.    haloperidol (HALDOL) 1 MG tablet Take 1 tablet (1 mg total) by mouth at bedtime. Qty: 1 tablet, Refills: 1    Iron-Vitamin C (VITRON-C) 65-125 MG TABS Take 1 tablet by mouth daily.    levothyroxine (SYNTHROID, LEVOTHROID) 75 MCG tablet Take 75 mcg by mouth daily before breakfast.    LORazepam (ATIVAN) 1 MG tablet Take 1 tablet (1 mg total) by mouth every 8 (eight) hours as needed for anxiety. Qty: 30 tablet, Refills: 0    magnesium oxide (MAG-OX) 400 MG tablet Take 400 mg by mouth daily.    nitroGLYCERIN (NITROSTAT) 0.4 MG SL tablet Place 0.4 mg under the tongue every 5 (five) minutes as needed for chest pain.    omeprazole (PRILOSEC) 20 MG capsule Take 20 mg by mouth daily.    oxyCODONE (OXY IR/ROXICODONE) 5 MG immediate release tablet Take 1-2 tablets (5-10 mg total) by mouth every 4 (four) hours as needed for breakthrough pain ((for MODERATE breakthrough pain)). Qty: 30 tablet, Refills: 0    potassium chloride SA (K-DUR,KLOR-CON) 20 MEQ tablet Take 20 mEq by mouth 2 (two) times daily.    propranolol (INDERAL) 40 MG tablet Take 40 mg by mouth daily.    senna (SENOKOT) 8.6 MG TABS tablet Take 1 tablet (8.6 mg total) by mouth 2 (two) times daily. Qty: 120 each, Refills: 0    topiramate (TOPAMAX) 100 MG tablet Take 50 mg by mouth 2 (two) times daily.    torsemide (DEMADEX) 20 MG tablet Take 30 mg by mouth daily.    vitamin B-12 (CYANOCOBALAMIN) 1000 MCG tablet Take 1,000 mcg by mouth daily at 12 noon.    warfarin (COUMADIN) 5 MG tablet Take 5 mg by mouth at bedtime.    acetaminophen (TYLENOL) 325 MG tablet Take 2 tablets (650 mg total) by mouth every 6 (six) hours as needed for  mild pain (or Fever >/= 101). Qty: 1 tablet, Refills: 1    bisacodyl (DULCOLAX) 10 MG suppository Place 1 suppository (10 mg total) rectally daily as needed for moderate constipation. Qty: 12 suppository, Refills: 0    minocycline (MINOCIN,DYNACIN) 100 MG capsule Take 1 capsule (100 mg total) by mouth 2 (two) times daily. Qty: 14 capsule, Refills: 0         DISCHARGE INSTRUCTIONS:    Patient is to follow-up with primary care physician orthopedist surgeon and cardiologist as outpatient, low BMP and discontinue diuretic if kidney function deteriorates. Patient's urine culture sensitivities are pending, please refer to them if patient is deteriorating clinically  If you experience worsening of your admission symptoms, develop shortness of breath, life threatening emergency, suicidal or homicidal thoughts you must seek medical attention immediately by calling 911 or calling your MD immediately  if symptoms less severe.  You Must read complete instructions/literature along with all the possible adverse reactions/side effects for all the Medicines you take and that have been prescribed to you. Take  any new Medicines after you have completely understood and accept all the possible adverse reactions/side effects.   Please note  You were cared for by a hospitalist during your hospital stay. If you have any questions about your discharge medications or the care you received while you were in the hospital after you are discharged, you can call the unit and asked to speak with the hospitalist on call if the hospitalist that took care of you is not available. Once you are discharged, your primary care physician will handle any further medical issues. Please note that NO REFILLS for any discharge medications will be authorized once you are discharged, as it is imperative that you return to your primary care physician (or establish a relationship with a primary care physician if you do not have one) for  your aftercare needs so that they can reassess your need for medications and monitor your lab values.    Today   CHIEF COMPLAINT:   Chief Complaint  Patient presents with  . Fall  . Laceration    HISTORY OF PRESENT ILLNESS:  Kenzleigh Sedam  is a 81 y.o. female with a known history of  recent right distal femur fracture status post ORIF by Dr. Rosita Kea who was just recently started on weightbearing as tolerated , who came to the hospital after fall in the facility. On arrival, she was complaining of right leg pain. Her labs revealed pyuria, concerning for urinary tract infection. Urine cultures were taken and they're growing Escherichia coli, sensitivities pending. Patient was initiated on the Rocephin and her condition overall improved. She was evaluated by physical therapist as well as Dr. Rosita Kea and felt that patient should continue weightbearing as tolerated and follow-up with orthopedist surgeon as outpatient. On arrival to the hospital patient was also noted to have mild elevation of troponin,. She however had no chest pain. Patient was seen by cardiologist and felt that conservative management should be continued. Echocardiogram was performed and it revealed normal ejection fraction of 50%, diastolic dysfunction, mild mitral regurgitation. Patient was advised to follow up with cardiologist as outpatient Discussion by problem 1. Elevated troponin, likely demand ischemia per cardiology, echocardiogram was unremarkable, she is to follow up with cardiologist as outpatient. She is to continue propranolol  2. Right lower extremity pain, distal femur x-ray showed status post ORIF of distal right femoral metadiaphyseal fracture with stable alignment,  patient is to follow-up with orthopedist surgeon as outpatient and continue weightbearing as tolerated in skilled nursing facility 3. Acute cystitis without hematuria due to Escherichia coli, sensitivities pending, however, patient improved clinically.  Continue Keflex orally for 5 more days to complete course 4. Fall, physical therapy to be continued 5. Essential hypertension, well-controlled 6. Hypothyroidism, she is to continue usual supplementation 7. Acute on chronic renal insufficiency, improved with IV fluid administration, follow clinically and lab wise, since patient is on diuretic and may be prone to dehydration    VITAL SIGNS:  Blood pressure 114/58, pulse 76, temperature 97.9 F (36.6 C), temperature source Oral, resp. rate 23, height 5\' 5"  (1.651 m), weight 87.454 kg (192 lb 12.8 oz), SpO2 100 %.  I/O:   Intake/Output Summary (Last 24 hours) at 03/14/15 1048 Last data filed at 03/14/15 0900  Gross per 24 hour  Intake 960.83 ml  Output      0 ml  Net 960.83 ml    PHYSICAL EXAMINATION:  GENERAL:  81 y.o.-year-old patient lying in the bed with no acute distress.  EYES: Pupils equal,  round, reactive to light and accommodation. No scleral icterus. Extraocular muscles intact.  HEENT: Head atraumatic, normocephalic. Oropharynx and nasopharynx clear.  NECK:  Supple, no jugular venous distention. No thyroid enlargement, no tenderness.  LUNGS: Normal breath sounds bilaterally, no wheezing, rales,rhonchi or crepitation. No use of accessory muscles of respiration.  CARDIOVASCULAR: S1, S2 normal. No murmurs, rubs, or gallops.  ABDOMEN: Soft, non-tender, non-distended. Bowel sounds present. No organomegaly or mass.  EXTREMITIES: No pedal edema, cyanosis, or clubbing.  NEUROLOGIC: Cranial nerves II through XII are intact. Muscle strength 5/5 in all extremities. Sensation intact. Gait not checked.  PSYCHIATRIC: The patient is alert and oriented x 3.  SKIN: No obvious rash, lesion, or ulcer.   DATA REVIEW:   CBC  Recent Labs Lab 03/14/15 0703  WBC 6.2  HGB 11.2*  HCT 33.5*  PLT 175    Chemistries   Recent Labs Lab 03/14/15 0703  NA 143  K 4.2  CL 113*  CO2 25  GLUCOSE 103*  BUN 21*  CREATININE 1.19*  CALCIUM  8.4*    Cardiac Enzymes  Recent Labs Lab 03/13/15 1210  TROPONINI 1.22*    Microbiology Results  Results for orders placed or performed during the hospital encounter of 03/12/15  MRSA PCR Screening     Status: None   Collection Time: 03/13/15 12:41 AM  Result Value Ref Range Status   MRSA by PCR NEGATIVE NEGATIVE Final    Comment:        The GeneXpert MRSA Assay (FDA approved for NASAL specimens only), is one component of a comprehensive MRSA colonization surveillance program. It is not intended to diagnose MRSA infection nor to guide or monitor treatment for MRSA infections.   Urine culture     Status: None (Preliminary result)   Collection Time: 03/13/15  5:09 AM  Result Value Ref Range Status   Specimen Description URINE, CATHETERIZED  Final   Special Requests Normal  Final   Culture   Final    >=100,000 COLONIES/mL ESCHERICHIA COLI SUSCEPTIBILITIES TO FOLLOW    Report Status PENDING  Incomplete    RADIOLOGY:  Dg Chest 2 View  03/12/2015  CLINICAL DATA:  Status post fall while going from bed to bathroom. Concern for chest injury. Initial encounter. EXAM: CHEST  2 VIEW COMPARISON:  Chest radiograph performed 01/20/2015 FINDINGS: The lungs are well-aerated. Mild left basilar atelectasis is noted. There is no evidence of pleural effusion or pneumothorax. The heart is borderline normal in size. No acute osseous abnormalities are seen. IMPRESSION: Mild left basilar atelectasis noted. Lungs otherwise grossly clear. No displaced rib fractures identified. Electronically Signed   By: Roanna Raider M.D.   On: 03/12/2015 22:26   Ct Head Wo Contrast  03/12/2015  CLINICAL DATA:  Status post fall at nursing home. Patient on Coumadin. Concern for head injury. Skin tear at the left forehead. Initial encounter. EXAM: CT HEAD WITHOUT CONTRAST TECHNIQUE: Contiguous axial images were obtained from the base of the skull through the vertex without intravenous contrast. COMPARISON:  None.  FINDINGS: There is no evidence of acute infarction, mass lesion, or intra- or extra-axial hemorrhage on CT. Prominence of the sulci suggests mild cortical volume loss. Mild cerebellar atrophy is noted. The brainstem and fourth ventricle are within normal limits. The basal ganglia are unremarkable in appearance. The cerebral hemispheres demonstrate grossly normal gray-white differentiation. No mass effect or midline shift is seen. There is no evidence of fracture; visualized osseous structures are unremarkable in appearance. The orbits are within  normal limits. The paranasal sinuses and mastoid air cells are well-aerated. Mild soft tissue injury is noted overlying the high left frontoparietal calvarium. IMPRESSION: 1. No evidence of traumatic intracranial injury or fracture. 2. Mild soft tissue injury noted overlying the high left frontoparietal calvarium. 3. Mild cortical volume loss noted. Electronically Signed   By: Roanna Raider M.D.   On: 03/12/2015 20:48   Dg Femur, Min 2 Views Right  03/13/2015  CLINICAL DATA:  Distal femoral pain and swelling after walking or standing since surgery, no new injury EXAM: RIGHT FEMUR 2 VIEWS COMPARISON:  01/22/2015 FINDINGS: Osseous demineralization. Components of RIGHT knee prosthesis identified in expected positions. Lateral plate and multiple screws seen at crossed a femoral diaphyseal fracture. Alignment at fracture appears little changed from the previous exam. Fracture margins now appear indistinct compatible with subacute age. No new fracture, dislocation or bone destruction. No periprosthetic or perihardware lucency. IMPRESSION: Post RIGHT total knee replacement. Post ORIF distal RIGHT femoral metadiaphyseal fracture with stable alignment since previous exam. Electronically Signed   By: Ulyses Southward M.D.   On: 03/13/2015 16:51    EKG:   Orders placed or performed during the hospital encounter of 03/12/15  . ED EKG  . ED EKG      Management plans discussed  with the patient, family and they are in agreement.  CODE STATUS:     Code Status Orders        Start     Ordered   03/12/15 2351  Full code   Continuous     03/12/15 2350      TOTAL TIME TAKING CARE OF THIS PATIENT: 40 minutes.    Katharina Caper M.D on 03/14/2015 at 10:48 AM  Between 7am to 6pm - Pager - [phone]  After 6pm go to [email or web address] - password [email or web address] [facility]  [place] [facility]  Office  [phone]  CC: Primary care physician; Danella Penton., MD

## 2015-03-14 NOTE — Progress Notes (Signed)
   SUBJECTIVE: Pt sitting up eating breakfast, she continues to deny CP or SOB. States she is feeling well.    Filed Vitals:   03/13/15 1140 03/13/15 1501 03/13/15 2001 03/14/15 0450  BP: 102/43  105/56 114/58  Pulse: 74 75 81 76  Temp: 97.6 F (36.4 C)  97.8 F (36.6 C) 97.9 F (36.6 C)  TempSrc:   Oral Oral  Resp: 18  19 23   Height:      Weight:      SpO2: 100% 95% 96% 100%    Intake/Output Summary (Last 24 hours) at 03/14/15 0858 Last data filed at 03/14/15 0700  Gross per 24 hour  Intake 840.83 ml  Output      0 ml  Net 840.83 ml    LABS: Basic Metabolic Panel:  Recent Labs  57/84/69 0615 03/14/15 0703  NA 145 143  K 3.9 4.2  CL 113* 113*  CO2 27 25  GLUCOSE 112* 103*  BUN 24* 21*  CREATININE 1.36* 1.19*  CALCIUM 8.1* 8.4*   Liver Function Tests: No results for input(s): AST, ALT, ALKPHOS, BILITOT, PROT, ALBUMIN in the last 72 hours. No results for input(s): LIPASE, AMYLASE in the last 72 hours. CBC:  Recent Labs  03/12/15 1955 03/13/15 0615 03/14/15 0703  WBC 9.4 8.5 6.2  NEUTROABS 6.0  --   --   HGB 11.8* 10.9* 11.2*  HCT 36.2 33.2* 33.5*  MCV 96.7 96.8 96.5  PLT 231 195 175   Cardiac Enzymes:  Recent Labs  03/13/15 0012 03/13/15 0615 03/13/15 1210  CKTOTAL  --  78  --   TROPONINI 1.57* 1.66* 1.22*   BNP: Invalid input(s): POCBNP D-Dimer: No results for input(s): DDIMER in the last 72 hours. Hemoglobin A1C: No results for input(s): HGBA1C in the last 72 hours. Fasting Lipid Panel: No results for input(s): CHOL, HDL, LDLCALC, TRIG, CHOLHDL, LDLDIRECT in the last 72 hours. Thyroid Function Tests: No results for input(s): TSH, T4TOTAL, T3FREE, THYROIDAB in the last 72 hours.  Invalid input(s): FREET3 Anemia Panel: No results for input(s): VITAMINB12, FOLATE, FERRITIN, TIBC, IRON, RETICCTPCT in the last 72 hours.   PHYSICAL EXAM General: Well developed, well nourished, in no acute distress HEENT:  Normocephalic and  atramatic Neck:  No JVD.  Lungs: Clear bilaterally to auscultation and percussion. Heart: HRRR . Normal S1 and S2 without gallops or murmurs.  Abdomen: Bowel sounds are positive, abdomen soft and non-tender  Msk:  Back normal, normal gait. Normal strength and tone for age. Extremities: No clubbing, cyanosis or edema.   Neuro: Alert and oriented X 3. Psych:  Good affect, responds appropriately  TELEMETRY: Reviewed telemetry pt in NSR with skipped beats and 1 pause < 2.5 second  ASSESSMENT AND PLAN:  Mildly elevated troponin due to most likely demand ischemia. But patient should be treated conservatively given her age and given the fact that she is not having any chest pain. Await echo results, if no significant LV dysfunction or WM, pt ok to d/c back to SNF. She is given f/u in our office 11/18 at 2:30   Patient and plan discussed with supervising provider, Dr. Adrian Blackwater, who agrees with above findings.   Victoria Johnston Margarito Courser [facility]  03/14/2015 8:58 AM

## 2015-03-14 NOTE — [place] FL2 (Signed)
[place] MEDICAID FL2 LEVEL OF CARE SCREENING TOOL     IDENTIFICATION  Patient Name: Victoria Johnston Birthdate: April 21, 1924 Sex: female Admission Date (Current Location): 03/12/2015  [facility] and [place] Number: Chiropodist and Address:  [facility], [address], [place], [place] [address]      Provider Number: [id number]  Attending Physician Name and Address:  Katharina Caper, MD  Relative Name and Phone Number:       Current Level of Care: Hospital Recommended Level of Care: Skilled Nursing Facility Prior Approval Number:    Date Approved/Denied:   PASRR Number:    Discharge Plan: SNF    Current Diagnoses: Patient Active Problem List   Diagnosis Date Noted  . Acute cystitis without hematuria 03/14/2015  . Right leg pain 03/14/2015  . Fall 03/12/2015  . Elevated troponin 03/12/2015  . Anxiety 03/12/2015  . CHF (congestive heart failure) (HCC) 03/12/2015  . Chronic obstructive asthma (HCC) 03/12/2015  . GERD (gastroesophageal reflux disease) 03/12/2015  . HTN (hypertension) 03/12/2015  . Hypothyroidism 03/12/2015  . Femur fracture (HCC) 01/20/2015    Orientation ACTIVITIES/SOCIAL BLADDER RESPIRATION    Self, Place    Incontinent Normal (4 liters O2)  BEHAVIORAL SYMPTOMS/MOOD NEUROLOGICAL BOWEL NUTRITION STATUS   (none)  (none) Incontinent Diet (currently npo)  PHYSICIAN VISITS COMMUNICATION OF NEEDS Height & Weight Skin  30 days Verbally   192 lbs. Normal          AMBULATORY STATUS RESPIRATION    Assist extensive Normal (4 liters O2)      Personal Care Assistance Level of Assistance  Bathing, Feeding, Dressing Bathing Assistance: Maximum assistance Feeding assistance: Maximum assistance Dressing Assistance: Maximum assistance      Functional Limitations Info                SPECIAL CARE FACTORS FREQUENCY                      Additional Factors Info  Code Status, Allergies Code Status Info:  full Allergies Info: codeine, lexapro, lyrica nysoline           Current Medications (03/14/2015): Current Facility-Administered Medications  Medication Dose Route Frequency Provider Last Rate Last Dose  . acetaminophen (TYLENOL) tablet 650 mg  650 mg Oral Q6H PRN Oralia Manis, MD      . cephALEXin [facility]) capsule 250 mg  250 mg Oral 3 times per day Katharina Caper, MD   250 mg at 03/14/15 0554  . haloperidol (HALDOL) tablet 1 mg  1 mg Oral QHS Oralia Manis, MD   1 mg at 03/13/15 2126  . levothyroxine (SYNTHROID, LEVOTHROID) tablet 75 mcg  75 mcg Oral QAC breakfast Oralia Manis, MD   75 mcg at 03/14/15 [phone]  . LORazepam (ATIVAN) tablet 1 mg  1 mg Oral Q8H PRN Oralia Manis, MD      . ondansetron [facility]) tablet 4 mg  4 mg Oral Q6H PRN Oralia Manis, MD       Or  . ondansetron [facility]) injection 4 mg  4 mg Intravenous Q6H PRN Oralia Manis, MD      . oxyCODONE (Oxy IR/ROXICODONE) immediate release tablet 5-10 mg  5-10 mg Oral Q4H PRN Oralia Manis, MD      . pantoprazole (PROTONIX) EC tablet 40 mg  40 mg Oral QAC breakfast Oralia Manis, MD   40 mg at 03/14/15 1478  . sodium chloride 0.9 % injection 3 mL  3 mL Intravenous Q12H Onalee Hua  Anne Hahn, MD   3 mL at 03/13/15 2127  . topiramate (TOPAMAX) tablet 50 mg  50 mg Oral BID Oralia Manis, MD   50 mg at 03/13/15 2126  . warfarin (COUMADIN) tablet 5 mg  5 mg Oral q1800 Katharina Caper, MD   5 mg at 03/13/15 1755   Do not use this list as official medication orders. Please verify with discharge summary.  Discharge Medications:   Medication List    TAKE these medications        acetaminophen 325 MG tablet  Commonly known as:  TYLENOL  Take 2 tablets (650 mg total) by mouth every 6 (six) hours as needed for mild pain (or Fever >/= 101).     bisacodyl 10 MG suppository  Commonly known as:  DULCOLAX  Place 1 suppository (10 mg total) rectally daily as needed for moderate constipation.     cephALEXin 250 MG capsule  Commonly known as:  KEFLEX   Take 1 capsule (250 mg total) by mouth every 8 (eight) hours.     cholecalciferol 1000 UNITS tablet  Commonly known as:  VITAMIN D  Take 4,000 Units by mouth daily.     docusate sodium 100 MG capsule  Commonly known as:  COLACE  Take 1 capsule (100 mg total) by mouth 2 (two) times daily.     folic acid 1 MG tablet  Commonly known as:  FOLVITE  Take 1 mg by mouth daily.     haloperidol 1 MG tablet  Commonly known as:  HALDOL  Take 1 tablet (1 mg total) by mouth at bedtime.     levothyroxine 75 MCG tablet  Commonly known as:  SYNTHROID, LEVOTHROID  Take 75 mcg by mouth daily before breakfast.     LORazepam 1 MG tablet  Commonly known as:  ATIVAN  Take 1 tablet (1 mg total) by mouth every 8 (eight) hours as needed for anxiety.     magnesium oxide 400 MG tablet  Commonly known as:  MAG-OX  Take 400 mg by mouth daily.     minocycline 100 MG capsule  Commonly known as:  MINOCIN,DYNACIN  Take 1 capsule (100 mg total) by mouth 2 (two) times daily.     nitroGLYCERIN 0.4 MG SL tablet  Commonly known as:  NITROSTAT  Place 0.4 mg under the tongue every 5 (five) minutes as needed for chest pain.     omeprazole 20 MG capsule  Commonly known as:  PRILOSEC  Take 20 mg by mouth daily.     oxyCODONE 5 MG immediate release tablet  Commonly known as:  Oxy IR/ROXICODONE  Take 1-2 tablets (5-10 mg total) by mouth every 4 (four) hours as needed for breakthrough pain ((for MODERATE breakthrough pain)).     potassium chloride SA 20 MEQ tablet  Commonly known as:  K-DUR,KLOR-CON  Take 20 mEq by mouth 2 (two) times daily.     propranolol 40 MG tablet  Commonly known as:  INDERAL  Take 40 mg by mouth daily.     senna 8.6 MG Tabs tablet  Commonly known as:  SENOKOT  Take 1 tablet (8.6 mg total) by mouth 2 (two) times daily.     topiramate 100 MG tablet  Commonly known as:  TOPAMAX  Take 50 mg by mouth 2 (two) times daily.     torsemide 20 MG tablet  Commonly known as:  DEMADEX   Take 30 mg by mouth daily.     vitamin B-12 1000 MCG tablet  Commonly known as:  CYANOCOBALAMIN  Take 1,000 mcg by mouth daily at 12 noon.     VITRON-C 65-125 MG Tabs  Generic drug:  Iron-Vitamin C  Take 1 tablet by mouth daily.     warfarin 5 MG tablet  Commonly known as:  COUMADIN  Take 5 mg by mouth at bedtime.        Relevant Imaging Results:  Relevant Lab Results:  Recent Labs    Additional Information    York Spaniel, LCSW

## 2015-03-15 LAB — URINE CULTURE: Special Requests: NORMAL

## 2015-05-16 ENCOUNTER — Encounter: Payer: Self-pay | Admitting: Emergency Medicine

## 2015-05-17 ENCOUNTER — Emergency Department: Payer: Medicare Other

## 2015-05-17 ENCOUNTER — Observation Stay: Payer: Medicare Other

## 2015-05-17 ENCOUNTER — Observation Stay
Admission: RE | Admit: 2015-05-17 | Discharge: 2015-05-19 | Disposition: A | Discharge status: Skilled Nursing Facility | Payer: Medicare Other | Attending: Internal Medicine | Admitting: Internal Medicine

## 2015-05-17 DIAGNOSIS — R451 Restlessness and agitation: Secondary | ICD-10-CM | POA: Diagnosis not present

## 2015-05-17 DIAGNOSIS — I130 Hypertensive heart and chronic kidney disease with heart failure and stage 1 through stage 4 chronic kidney disease, or unspecified chronic kidney disease: Secondary | ICD-10-CM | POA: Diagnosis not present

## 2015-05-17 DIAGNOSIS — I509 Heart failure, unspecified: Secondary | ICD-10-CM | POA: Insufficient documentation

## 2015-05-17 DIAGNOSIS — Z1624 Resistance to multiple antibiotics: Secondary | ICD-10-CM | POA: Diagnosis not present

## 2015-05-17 DIAGNOSIS — R4182 Altered mental status, unspecified: Secondary | ICD-10-CM | POA: Diagnosis present

## 2015-05-17 DIAGNOSIS — R609 Edema, unspecified: Secondary | ICD-10-CM

## 2015-05-17 DIAGNOSIS — K219 Gastro-esophageal reflux disease without esophagitis: Secondary | ICD-10-CM | POA: Diagnosis not present

## 2015-05-17 DIAGNOSIS — Z79899 Other long term (current) drug therapy: Secondary | ICD-10-CM | POA: Diagnosis not present

## 2015-05-17 DIAGNOSIS — H9190 Unspecified hearing loss, unspecified ear: Secondary | ICD-10-CM | POA: Diagnosis not present

## 2015-05-17 DIAGNOSIS — Z9181 History of falling: Secondary | ICD-10-CM | POA: Diagnosis not present

## 2015-05-17 DIAGNOSIS — F0390 Unspecified dementia without behavioral disturbance: Secondary | ICD-10-CM | POA: Diagnosis not present

## 2015-05-17 DIAGNOSIS — N390 Urinary tract infection, site not specified: Secondary | ICD-10-CM | POA: Diagnosis not present

## 2015-05-17 DIAGNOSIS — R0600 Dyspnea, unspecified: Secondary | ICD-10-CM | POA: Insufficient documentation

## 2015-05-17 DIAGNOSIS — B9620 Unspecified Escherichia coli [E. coli] as the cause of diseases classified elsewhere: Secondary | ICD-10-CM | POA: Insufficient documentation

## 2015-05-17 DIAGNOSIS — Z833 Family history of diabetes mellitus: Secondary | ICD-10-CM | POA: Diagnosis not present

## 2015-05-17 DIAGNOSIS — M7989 Other specified soft tissue disorders: Secondary | ICD-10-CM | POA: Diagnosis not present

## 2015-05-17 DIAGNOSIS — Z7901 Long term (current) use of anticoagulants: Secondary | ICD-10-CM | POA: Diagnosis not present

## 2015-05-17 DIAGNOSIS — R51 Headache: Secondary | ICD-10-CM | POA: Diagnosis not present

## 2015-05-17 DIAGNOSIS — R296 Repeated falls: Secondary | ICD-10-CM | POA: Insufficient documentation

## 2015-05-17 DIAGNOSIS — Z885 Allergy status to narcotic agent status: Secondary | ICD-10-CM | POA: Insufficient documentation

## 2015-05-17 DIAGNOSIS — Z888 Allergy status to other drugs, medicaments and biological substances status: Secondary | ICD-10-CM | POA: Insufficient documentation

## 2015-05-17 DIAGNOSIS — G259 Extrapyramidal and movement disorder, unspecified: Secondary | ICD-10-CM | POA: Insufficient documentation

## 2015-05-17 DIAGNOSIS — R410 Disorientation, unspecified: Secondary | ICD-10-CM | POA: Insufficient documentation

## 2015-05-17 DIAGNOSIS — N3000 Acute cystitis without hematuria: Secondary | ICD-10-CM | POA: Diagnosis not present

## 2015-05-17 DIAGNOSIS — E785 Hyperlipidemia, unspecified: Secondary | ICD-10-CM | POA: Insufficient documentation

## 2015-05-17 DIAGNOSIS — G250 Essential tremor: Secondary | ICD-10-CM | POA: Diagnosis not present

## 2015-05-17 DIAGNOSIS — R918 Other nonspecific abnormal finding of lung field: Secondary | ICD-10-CM | POA: Diagnosis not present

## 2015-05-17 DIAGNOSIS — Z86718 Personal history of other venous thrombosis and embolism: Secondary | ICD-10-CM | POA: Insufficient documentation

## 2015-05-17 DIAGNOSIS — E039 Hypothyroidism, unspecified: Secondary | ICD-10-CM | POA: Insufficient documentation

## 2015-05-17 DIAGNOSIS — R600 Localized edema: Secondary | ICD-10-CM | POA: Insufficient documentation

## 2015-05-17 DIAGNOSIS — Z8249 Family history of ischemic heart disease and other diseases of the circulatory system: Secondary | ICD-10-CM | POA: Insufficient documentation

## 2015-05-17 DIAGNOSIS — F419 Anxiety disorder, unspecified: Secondary | ICD-10-CM | POA: Insufficient documentation

## 2015-05-17 DIAGNOSIS — M79604 Pain in right leg: Secondary | ICD-10-CM | POA: Diagnosis not present

## 2015-05-17 DIAGNOSIS — I7300 Raynaud's syndrome without gangrene: Secondary | ICD-10-CM | POA: Diagnosis not present

## 2015-05-17 DIAGNOSIS — N189 Chronic kidney disease, unspecified: Secondary | ICD-10-CM | POA: Diagnosis not present

## 2015-05-17 DIAGNOSIS — I739 Peripheral vascular disease, unspecified: Secondary | ICD-10-CM | POA: Insufficient documentation

## 2015-05-17 DIAGNOSIS — J449 Chronic obstructive pulmonary disease, unspecified: Secondary | ICD-10-CM | POA: Insufficient documentation

## 2015-05-17 DIAGNOSIS — K5090 Crohn's disease, unspecified, without complications: Secondary | ICD-10-CM | POA: Insufficient documentation

## 2015-05-17 DIAGNOSIS — E876 Hypokalemia: Secondary | ICD-10-CM | POA: Diagnosis not present

## 2015-05-17 DIAGNOSIS — R748 Abnormal levels of other serum enzymes: Secondary | ICD-10-CM | POA: Insufficient documentation

## 2015-05-17 LAB — URINALYSIS COMPLETE WITH MICROSCOPIC (ARMC ONLY)
BILIRUBIN URINE: NEGATIVE
GLUCOSE, UA: NEGATIVE mg/dL
Leukocytes, UA: NEGATIVE
NITRITE: NEGATIVE
Protein, ur: NEGATIVE mg/dL
SPECIFIC GRAVITY, URINE: 1.006 (ref 1.005–1.030)
pH: 5 (ref 5.0–8.0)

## 2015-05-17 LAB — COMPREHENSIVE METABOLIC PANEL
ALBUMIN: 2.8 g/dL — AB (ref 3.5–5.0)
ALK PHOS: 83 U/L (ref 38–126)
ALT: 8 U/L — AB (ref 14–54)
AST: 14 U/L — AB (ref 15–41)
Anion gap: 5 (ref 5–15)
BILIRUBIN TOTAL: 0.8 mg/dL (ref 0.3–1.2)
BUN: 14 mg/dL (ref 6–20)
CALCIUM: 8.5 mg/dL — AB (ref 8.9–10.3)
CO2: 23 mmol/L (ref 22–32)
CREATININE: 1.11 mg/dL — AB (ref 0.44–1.00)
Chloride: 112 mmol/L — ABNORMAL HIGH (ref 101–111)
GFR calc Af Amer: 50 mL/min — ABNORMAL LOW (ref >60)
GFR calc non Af Amer: 43 mL/min — ABNORMAL LOW (ref >60)
Glucose, Bld: 104 mg/dL — ABNORMAL HIGH (ref 65–99)
Potassium: 3.4 mmol/L — ABNORMAL LOW (ref 3.5–5.1)
Sodium: 140 mmol/L (ref 135–145)
Total Protein: 5.6 g/dL — ABNORMAL LOW (ref 6.5–8.1)

## 2015-05-17 LAB — PROTIME-INR
INR: 2.44
Prothrombin Time: 26.2 seconds — ABNORMAL HIGH (ref 11.4–15.0)

## 2015-05-17 LAB — CBC
HEMATOCRIT: 35.3 % (ref 35.0–47.0)
HEMOGLOBIN: 11.5 g/dL — AB (ref 12.0–16.0)
MCH: 29.8 pg (ref 26.0–34.0)
MCHC: 32.7 g/dL (ref 32.0–36.0)
MCV: 91.1 fL (ref 80.0–100.0)
Platelets: 179 10*3/uL (ref 150–440)
RBC: 3.87 MIL/uL (ref 3.80–5.20)
RDW: 14.8 % — AB (ref 11.5–14.5)
WBC: 6.5 10*3/uL (ref 3.6–11.0)

## 2015-05-17 LAB — TSH: TSH: 0.498 u[IU]/mL (ref 0.350–4.500)

## 2015-05-17 LAB — HEMOGLOBIN A1C: Hgb A1c MFr Bld: 5.3 % (ref 4.0–6.0)

## 2015-05-17 MED ORDER — HALOPERIDOL LACTATE 5 MG/ML IJ SOLN
2.0000 mg | Freq: Once | INTRAMUSCULAR | Status: AC
  Administered 2015-05-17: 2 mg via INTRAVENOUS
  Filled 2015-05-17: qty 1

## 2015-05-17 MED ORDER — PANTOPRAZOLE SODIUM 40 MG PO TBEC
40.0000 mg | DELAYED_RELEASE_TABLET | Freq: Two times a day (BID) | ORAL | Status: DC
  Administered 2015-05-17 – 2015-05-19 (×5): 40 mg via ORAL
  Filled 2015-05-17 (×5): qty 1

## 2015-05-17 MED ORDER — PROPRANOLOL HCL 20 MG PO TABS
20.0000 mg | ORAL_TABLET | Freq: Every day | ORAL | Status: DC
  Administered 2015-05-17 – 2015-05-19 (×3): 20 mg via ORAL
  Filled 2015-05-17 (×3): qty 1

## 2015-05-17 MED ORDER — ONDANSETRON HCL 4 MG PO TABS: 4.0000 mg | ORAL_TABLET | Freq: Four times a day (QID) | ORAL | Status: DC | PRN

## 2015-05-17 MED ORDER — ACETAMINOPHEN 650 MG RE SUPP
650.0000 mg | Freq: Four times a day (QID) | RECTAL | Status: DC | PRN
Start: 2015-05-17 — End: 2015-05-19

## 2015-05-17 MED ORDER — DIPHENHYDRAMINE HCL 50 MG/ML IJ SOLN
25.0000 mg | Freq: Once | INTRAMUSCULAR | Status: AC
  Administered 2015-05-17: 25 mg via INTRAVENOUS
  Filled 2015-05-17: qty 1

## 2015-05-17 MED ORDER — WARFARIN SODIUM 5 MG PO TABS
5.0000 mg | ORAL_TABLET | Freq: Every day | ORAL | Status: DC
  Administered 2015-05-17 – 2015-05-18 (×2): 5 mg via ORAL
  Filled 2015-05-17 (×4): qty 1

## 2015-05-17 MED ORDER — HEPARIN SODIUM (PORCINE) 5000 UNIT/ML IJ SOLN: 5000.0000 [IU] | Freq: Three times a day (TID) | INTRAMUSCULAR | Status: DC

## 2015-05-17 MED ORDER — SODIUM CHLORIDE 0.9 % IJ SOLN
3.0000 mL | Freq: Two times a day (BID) | INTRAMUSCULAR | Status: DC
  Administered 2015-05-17 – 2015-05-19 (×5): 3 mL via INTRAVENOUS

## 2015-05-17 MED ORDER — SODIUM CHLORIDE 0.9 % IV SOLN
INTRAVENOUS | Status: DC
  Administered 2015-05-17 – 2015-05-18 (×3): via INTRAVENOUS

## 2015-05-17 MED ORDER — HALOPERIDOL LACTATE 5 MG/ML IJ SOLN
5.0000 mg | Freq: Once | INTRAMUSCULAR | Status: AC
  Administered 2015-05-17: 5 mg via INTRAVENOUS
  Filled 2015-05-17: qty 1

## 2015-05-17 MED ORDER — TOPIRAMATE 25 MG PO TABS
50.0000 mg | ORAL_TABLET | Freq: Every day | ORAL | Status: DC
  Filled 2015-05-17: qty 2

## 2015-05-17 MED ORDER — DOCUSATE SODIUM 100 MG PO CAPS
100.0000 mg | ORAL_CAPSULE | Freq: Two times a day (BID) | ORAL | Status: DC
  Administered 2015-05-17 – 2015-05-19 (×5): 100 mg via ORAL
  Filled 2015-05-17 (×5): qty 1

## 2015-05-17 MED ORDER — LORAZEPAM 2 MG/ML IJ SOLN
1.0000 mg | Freq: Once | INTRAMUSCULAR | Status: AC
  Administered 2015-05-17: 1 mg via INTRAVENOUS
  Filled 2015-05-17: qty 1

## 2015-05-17 MED ORDER — ONDANSETRON HCL 4 MG/2ML IJ SOLN: 4.0000 mg | Freq: Four times a day (QID) | INTRAMUSCULAR | Status: DC | PRN

## 2015-05-17 MED ORDER — LORAZEPAM 1 MG PO TABS: 1.0000 mg | ORAL_TABLET | Freq: Three times a day (TID) | ORAL | Status: DC | PRN

## 2015-05-17 MED ORDER — FERROUS SULFATE 325 (65 FE) MG PO TABS
325.0000 mg | ORAL_TABLET | Freq: Every day | ORAL | Status: DC
  Administered 2015-05-17 – 2015-05-19 (×3): 325 mg via ORAL
  Filled 2015-05-17 (×3): qty 1

## 2015-05-17 MED ORDER — ACETAMINOPHEN 325 MG PO TABS: 650.0000 mg | ORAL_TABLET | Freq: Four times a day (QID) | ORAL | Status: DC | PRN

## 2015-05-17 MED ORDER — POTASSIUM CHLORIDE IN NACL 40-0.9 MEQ/L-% IV SOLN
INTRAVENOUS | Status: DC
  Filled 2015-05-17 (×3): qty 1000

## 2015-05-17 MED ORDER — DEXTROSE 5 % IV SOLN
1.0000 g | INTRAVENOUS | Status: DC
  Administered 2015-05-17: 1 g via INTRAVENOUS
  Filled 2015-05-17: qty 10

## 2015-05-17 MED ORDER — VITAMIN C 500 MG PO TABS
250.0000 mg | ORAL_TABLET | Freq: Every day | ORAL | Status: DC
  Administered 2015-05-17 – 2015-05-19 (×3): 250 mg via ORAL
  Filled 2015-05-17 (×3): qty 1

## 2015-05-17 MED ORDER — HALOPERIDOL LACTATE 5 MG/ML IJ SOLN
1.0000 mg | Freq: Four times a day (QID) | INTRAMUSCULAR | Status: DC | PRN
Start: 2015-05-17 — End: 2015-05-19
  Administered 2015-05-18: 1 mg via INTRAVENOUS
  Filled 2015-05-17: qty 1

## 2015-05-17 MED ORDER — DOXEPIN HCL 10 MG PO CAPS
10.0000 mg | ORAL_CAPSULE | Freq: Every day | ORAL | Status: DC
  Administered 2015-05-17 – 2015-05-19 (×3): 10 mg via ORAL
  Filled 2015-05-17 (×3): qty 1

## 2015-05-17 MED ORDER — HALOPERIDOL 1 MG PO TABS
1.0000 mg | ORAL_TABLET | Freq: Every day | ORAL | Status: DC
  Administered 2015-05-17: 1 mg via ORAL
  Filled 2015-05-17: qty 1

## 2015-05-17 MED ORDER — WARFARIN - PHYSICIAN DOSING INPATIENT: Freq: Every day | Status: DC

## 2015-05-17 MED ORDER — LEVOTHYROXINE SODIUM 75 MCG PO TABS
75.0000 ug | ORAL_TABLET | Freq: Every day | ORAL | Status: DC
  Administered 2015-05-17 – 2015-05-19 (×3): 75 ug via ORAL
  Filled 2015-05-17 (×3): qty 1

## 2015-05-17 MED ORDER — IRON-VITAMIN C 65-125 MG PO TABS: 1.0000 | ORAL_TABLET | Freq: Every day | ORAL | Status: DC

## 2015-05-17 MED ORDER — NITROGLYCERIN 0.4 MG SL SUBL: 0.4000 mg | SUBLINGUAL_TABLET | SUBLINGUAL | Status: DC | PRN

## 2015-05-17 MED ORDER — NYSTATIN 100000 UNIT/GM EX CREA
1.0000 "application " | TOPICAL_CREAM | Freq: Three times a day (TID) | CUTANEOUS | Status: DC
  Administered 2015-05-17 – 2015-05-19 (×7): 1 via TOPICAL
  Filled 2015-05-17: qty 15

## 2015-05-17 MED ORDER — LORAZEPAM 2 MG/ML IJ SOLN
1.0000 mg | INTRAMUSCULAR | Status: DC | PRN
  Administered 2015-05-19: 2 mg via INTRAVENOUS
  Filled 2015-05-17: qty 1

## 2015-05-17 MED ORDER — RISPERIDONE 0.5 MG PO TBDP
0.5000 mg | ORAL_TABLET | Freq: Two times a day (BID) | ORAL | Status: DC
  Administered 2015-05-17 – 2015-05-19 (×5): 0.5 mg via ORAL
  Filled 2015-05-17 (×5): qty 1

## 2015-05-17 NOTE — Progress Notes (Addendum)
[facility] [place] at [facility]   PATIENT NAME: Victoria Johnston    MR#:  [id number]  DATE OF BIRTH:  [birth year]  SUBJECTIVE:  CHIEF COMPLAINT:   Chief Complaint  Patient presents with  . Altered Mental Status   - very confused, thinks she is at work- living with her husband and her daughter is in school - underlying dementia, received haldol prior to coming to floor - no focal deficits - sitter at bedside  REVIEW OF SYSTEMS:  Review of Systems  Unable to perform ROS: dementia    DRUG ALLERGIES:   Allergies  Allergen Reactions  . Codeine Nausea Only  . Lexapro [Escitalopram Oxalate] Other (See Comments)    Reaction: unknown  . Lyrica [Pregabalin] Other (See Comments)    Reaction: unknown  . Mysoline [Primidone] Other (See Comments)    Reaction: unknown    VITALS:  Blood pressure 120/51, pulse 57, temperature 97.8 F (36.6 C), temperature source Oral, resp. rate 22, height 5\' 3"  (1.6 m), weight 80.922 kg (178 lb 6.4 oz), SpO2 98 %.  PHYSICAL EXAMINATION:  Physical Exam  GENERAL:  82 y.o.-year-old patient lying in the bed with no acute distress.  EYES: Pupils equal, round, reactive to light and accommodation. No scleral icterus. Extraocular muscles intact.  HEENT: Head atraumatic, normocephalic. Oropharynx and nasopharynx clear.  NECK:  Supple, no jugular venous distention. No thyroid enlargement, no tenderness.  LUNGS: Normal breath sounds bilaterally, no wheezing, rales,rhonchi or crepitation. No use of accessory muscles of respiration.  CARDIOVASCULAR: S1, S2 normal. No rubs, or gallops. 3/6 systolic murmur present. ABDOMEN: Soft, nontender, nondistended. Bowel sounds present. No organomegaly or mass.  EXTREMITIES: No pedal edema, cyanosis, or clubbing.  NEUROLOGIC: No cranial nerve deficits. Following simple commands. Sensation intact. Gait not checked.  PSYCHIATRIC: The patient is alert and oriented to self.  SKIN: No obvious rash,  lesion, or ulcer.    LABORATORY PANEL:   CBC  Recent Labs Lab 05/17/15 0050  WBC 6.5  HGB 11.5*  HCT 35.3  PLT 179   ------------------------------------------------------------------------------------------------------------------  Chemistries   Recent Labs Lab 05/17/15 0050  NA 140  K 3.4*  CL 112*  CO2 23  GLUCOSE 104*  BUN 14  CREATININE 1.11*  CALCIUM 8.5*  AST 14*  ALT 8*  ALKPHOS 83  BILITOT 0.8   ------------------------------------------------------------------------------------------------------------------  Cardiac Enzymes No results for input(s): TROPONINI in the last 168 hours. ------------------------------------------------------------------------------------------------------------------  RADIOLOGY:  Dg Chest 2 View  05/17/2015  CLINICAL DATA:  Acute onset of dyspnea and altered mental status. Significant upper chest and right arm bruising. Multiple falls in the past several months. Initial encounter. EXAM: CHEST  2 VIEW COMPARISON:  Chest radiograph performed 03/12/2015 FINDINGS: The lungs are well-aerated. Mild bibasilar opacities may reflect atelectasis or possibly minimal pneumonia. There is no evidence of pleural effusion or pneumothorax. The heart is borderline normal in size. No acute osseous abnormalities are seen. IMPRESSION: Mild bibasilar opacities may reflect atelectasis or possibly minimal pneumonia. Electronically Signed   By: Roanna Raider M.D.   On: 05/17/2015 02:02   Ct Head Wo Contrast  05/17/2015  CLINICAL DATA:  Acute onset of altered mental status. Multiple falls in the past several months. Initial encounter. EXAM: CT HEAD WITHOUT CONTRAST TECHNIQUE: Contiguous axial images were obtained from the base of the skull through the vertex without intravenous contrast. COMPARISON:  CT of the head performed 03/12/2015 FINDINGS: There is no evidence of acute infarction, mass lesion, or intra- or extra-axial  hemorrhage on CT. Prominence of the  ventricles and sulci reflects mild cortical volume loss. Mild cerebellar atrophy is noted. Mild periventricular white matter change likely reflects small vessel ischemic microangiopathy. The brainstem and fourth ventricle are within normal limits. The basal ganglia are unremarkable in appearance. The cerebral hemispheres demonstrate grossly normal gray-white differentiation. No mass effect or midline shift is seen. There is no evidence of fracture; visualized osseous structures are unremarkable in appearance. The orbits are within normal limits. The paranasal sinuses and mastoid air cells are well-aerated. No significant soft tissue abnormalities are seen. IMPRESSION: 1. No acute intracranial pathology seen on CT. 2. Mild cortical volume loss and scattered small vessel ischemic microangiopathy. Electronically Signed   By: Roanna Raider M.D.   On: 05/17/2015 02:19    EKG:   Orders placed or performed during the hospital encounter of 05/17/15  . ED EKG  . ED EKG  . EKG 12-Lead  . EKG 12-Lead    ASSESSMENT AND PLAN:   82 year old female with past medical history significant for DVT on Coumadin from June 2015, Raynaud's disease, GERD, hypertension, CHF and asthma was brought into the hospital secondary to mental status changes.  #1 altered mental status-with significant agitation. -Likely from acute delirium vs dementia.  -She was just seen by her PCP about 2 and half weeks ago for similar complaints. She was started on Ativan and also Haldol and was advised to follow up in 3 weeks. -No evidence of any infection noted at this time. But 3 weeks ago had multidrug resistant E.coli adn treated with macrobid- UA now appears improved- so rocephin discontinued. -CT of the head without any significant findings other than chronic small vessel ischemic changes. Also has significant tremors noted. We'll see if neurology can see her to rule out any lower body dementia. -Discussed with family about the onset of  all the symptoms. Had prior similar episodes but mostly when she had hypothyroidism or UTIs- but also on haldol and ativan at home- may be did have dementia. -Until then continue sitter. No focal neuro deficits noted. -Started on risperidone twice a day. Also use Ativan and Haldol when necessary.  #2 essential tremors-on  propranolol. Discontinue Topamax  #3 history of DVT in 2015-currently on Coumadin. Pharmacy consulted for the same. -Due to recent history of falls, not sure if she would be an appropriate candidate for long-term anticoagulation. -Years have been ordered, if there is no DVT-discussed with family about taking her off of Coumadin.  #4 GERD-on Protonix  #5 hypothyroidism-on Synthroid  #6 DVT prophylaxis-on Coumadin   All the records are reviewed and case discussed with Care Management/Social Workerr. Management plans discussed with the patient, family and they are in agreement.  CODE STATUS: Full Code  TOTAL TIME TAKING CARE OF THIS PATIENT: 36 minutes.   POSSIBLE D/C IN 2 DAYS, DEPENDING ON CLINICAL CONDITION.   Enid Baas M.D on 05/17/2015 at 1:45 PM  Between 7am to 6pm - Pager - [phone]  After 6pm go to [email or web address] - password [email or web address] [facility]  [place]  Office  [phone]  CC: Primary care physician; Danella Penton., MD

## 2015-05-17 NOTE — ED Provider Notes (Signed)
[facility] Emergency Department Provider Note  ____________________________________________  Time seen: 1:10 AM  I have reviewed the triage vital signs and the nursing notes.   HISTORY  Chief Complaint Altered Mental Status      HPI Victoria Johnston is a 82 y.o. female resents with altered mental status per EMS. EMS states that they were notified by the patient's daughter that she recently completed a course of unknown antibiotics for urinary tract infection. EMS states that the patient's daughter informed them that she has had multiple falls in the past few months     Past Medical History  Diagnosis Date  . Swelling   . Hearing loss   . Difficulty breathing   . Bruises easily   . Tremors of nervous system   . HLD (hyperlipidemia)   . Crohn disease (HCC)   . GERD (gastroesophageal reflux disease)   . Raynaud disease   . HTN (hypertension)   . OA (osteoarthritis)   . Chronic obstructive asthma (HCC)   . CHF (congestive heart failure) (HCC)   . DVT (deep vein thrombosis) in pregnancy   . Anxiety     Patient Active Problem List   Diagnosis Date Noted  . Acute cystitis without hematuria 03/14/2015  . Right leg pain 03/14/2015  . Fall 03/12/2015  . Elevated troponin 03/12/2015  . Anxiety 03/12/2015  . CHF (congestive heart failure) (HCC) 03/12/2015  . Chronic obstructive asthma (HCC) 03/12/2015  . GERD (gastroesophageal reflux disease) 03/12/2015  . HTN (hypertension) 03/12/2015  . Hypothyroidism 03/12/2015  . Femur fracture (HCC) 01/20/2015    Past Surgical History  Procedure Laterality Date  . Abdominal hysterectomy    . Knee surgery Bilateral   . Foot surgery Bilateral   . Orif femur fracture Right 01/22/2015    Procedure: OPEN REDUCTION INTERNAL FIXATION (ORIF) DISTAL FEMUR FRACTURE;  Surgeon: Kennedy Bucker, MD;  Location: [facility] ORS;  Service: Orthopedics;  Laterality: Right;    Current Outpatient Rx  Name  Route  Sig  Dispense   Refill  . acetaminophen (TYLENOL) 325 MG tablet   Oral   Take 2 tablets (650 mg total) by mouth every 6 (six) hours as needed for mild pain (or Fever >/= 101). Patient not taking: Reported on 03/12/2015   1 tablet   1   . bisacodyl (DULCOLAX) 10 MG suppository   Rectal   Place 1 suppository (10 mg total) rectally daily as needed for moderate constipation. Patient not taking: Reported on 03/12/2015   12 suppository   0   . cephALEXin (KEFLEX) 250 MG capsule   Oral   Take 1 capsule (250 mg total) by mouth every 8 (eight) hours.   15 capsule   0   . cholecalciferol (VITAMIN D) 1000 UNITS tablet   Oral   Take 4,000 Units by mouth daily.         Marland Kitchen docusate sodium (COLACE) 100 MG capsule   Oral   Take 1 capsule (100 mg total) by mouth 2 (two) times daily.   10 capsule   0   . folic acid (FOLVITE) 1 MG tablet   Oral   Take 1 mg by mouth daily.         . haloperidol (HALDOL) 1 MG tablet   Oral   Take 1 tablet (1 mg total) by mouth at bedtime.   1 tablet   1   . Iron-Vitamin C (VITRON-C) 65-125 MG TABS   Oral   Take 1 tablet by  mouth daily.         Marland Kitchen levothyroxine (SYNTHROID, LEVOTHROID) 75 MCG tablet   Oral   Take 75 mcg by mouth daily before breakfast.         . LORazepam (ATIVAN) 1 MG tablet   Oral   Take 1 tablet (1 mg total) by mouth every 8 (eight) hours as needed for anxiety.   30 tablet   0   . magnesium oxide (MAG-OX) 400 MG tablet   Oral   Take 400 mg by mouth daily.         . minocycline (MINOCIN,DYNACIN) 100 MG capsule   Oral   Take 1 capsule (100 mg total) by mouth 2 (two) times daily. Patient not taking: Reported on 03/12/2015   14 capsule   0   . nitroGLYCERIN (NITROSTAT) 0.4 MG SL tablet   Sublingual   Place 0.4 mg under the tongue every 5 (five) minutes as needed for chest pain.         Marland Kitchen omeprazole (PRILOSEC) 20 MG capsule   Oral   Take 20 mg by mouth daily.         Marland Kitchen oxyCODONE (OXY IR/ROXICODONE) 5 MG immediate release  tablet   Oral   Take 1-2 tablets (5-10 mg total) by mouth every 4 (four) hours as needed for breakthrough pain ((for MODERATE breakthrough pain)).   30 tablet   0   . potassium chloride SA (K-DUR,KLOR-CON) 20 MEQ tablet   Oral   Take 20 mEq by mouth 2 (two) times daily.         . propranolol (INDERAL) 40 MG tablet   Oral   Take 40 mg by mouth daily.         Marland Kitchen senna (SENOKOT) 8.6 MG TABS tablet   Oral   Take 1 tablet (8.6 mg total) by mouth 2 (two) times daily.   120 each   0   . topiramate (TOPAMAX) 100 MG tablet   Oral   Take 50 mg by mouth 2 (two) times daily.         Marland Kitchen torsemide (DEMADEX) 20 MG tablet   Oral   Take 30 mg by mouth daily.         . vitamin B-12 (CYANOCOBALAMIN) 1000 MCG tablet   Oral   Take 1,000 mcg by mouth daily at 12 noon.         . warfarin (COUMADIN) 5 MG tablet   Oral   Take 5 mg by mouth at bedtime.           Allergies Codeine; Lexapro; Lyrica; and Mysoline  Family History  Problem Relation Age of Onset  . CAD    . Diabetes    . Hypertension      Social History Social History  Substance Use Topics  . Smoking status: Never Smoker   . Smokeless tobacco: Never Used  . Alcohol Use: No    Review of Systems  Constitutional: Negative for fever. Eyes: Negative for visual changes. ENT: Negative for sore throat. Cardiovascular: Negative for chest pain. Respiratory: Negative for shortness of breath. Gastrointestinal: Negative for abdominal pain, vomiting and diarrhea. Genitourinary: Negative for dysuria. Musculoskeletal: Negative for back pain. Skin: Positive for bruising Neurological: Negative for headaches, focal weakness or numbness. Positive for altered mental status 10-point ROS otherwise negative.  ____________________________________________   PHYSICAL EXAM:  VITAL SIGNS: ED Triage Vitals  Enc Vitals Group     BP 05/17/15 0007 127/92 mmHg     Pulse  Rate 05/17/15 0007 71     Resp 05/17/15 0007 16     Temp  05/17/15 0007 98.7 F (37.1 C)     Temp Source 05/17/15 0007 Oral     SpO2 05/17/15 0007 97 %     Weight 05/17/15 0007 200 lb (90.719 kg)     Height 05/17/15 0007 5\' 3"  (1.6 m)     Head Cir --      Peak Flow --      Pain Score --      Pain Loc --      Pain Edu? --      Excl. in GC? --      Constitutional: Alert but confused Eyes: Conjunctivae are normal. PERRL. Normal extraocular movements. ENT   Head: Normocephalic and atraumatic.   Nose: No congestion/rhinnorhea.   Mouth/Throat: Mucous membranes are moist.   Neck: No stridor. Hematological/Lymphatic/Immunilogical: No cervical lymphadenopathy. Cardiovascular: Normal rate, regular rhythm. Normal and symmetric distal pulses are present in all extremities. No murmurs, rubs, or gallops. Respiratory: Normal respiratory effort without tachypnea nor retractions. Breath sounds are clear and equal bilaterally. No wheezes/rales/rhonchi. Gastrointestinal: Soft and nontender. No distention. There is no CVA tenderness. Genitourinary: deferred Musculoskeletal: Nontender with normal range of motion in all extremities. No joint effusions.  No lower extremity tenderness nor edema. Neurologic:  Normal speech and language. No gross focal neurologic deficits are appreciated. Speech is normal.  Skin:  Ecchymoses noted on the patient's right chest wall extending to the right upper back as well as bilateral lower extremity. All contusions with varying stages of healing. Psychiatric: Mood and affect are normal. Speech and behavior are normal. Patient exhibits appropriate insight and judgment.  ____________________________________________    LABS (pertinent positives/negatives)  Labs Reviewed  CBC - Abnormal; Notable for the following:    Hemoglobin 11.5 (*)    RDW 14.8 (*)    All other components within normal limits  COMPREHENSIVE METABOLIC PANEL - Abnormal; Notable for the following:    Potassium 3.4 (*)    Chloride 112 (*)     Glucose, Bld 104 (*)    Creatinine, Ser 1.11 (*)    Calcium 8.5 (*)    Total Protein 5.6 (*)    Albumin 2.8 (*)    AST 14 (*)    ALT 8 (*)    GFR calc non Af Amer 43 (*)    GFR calc Af Amer 50 (*)    All other components within normal limits  URINALYSIS COMPLETEWITH MICROSCOPIC ([facility] ONLY) - Abnormal; Notable for the following:    Color, Urine YELLOW (*)    APPearance CLEAR (*)    Ketones, ur TRACE (*)    Hgb urine dipstick 1+ (*)    Bacteria, UA RARE (*)    Squamous Epithelial / LPF 0-5 (*)    All other components within normal limits  CULTURE, BLOOD (ROUTINE X 2)  CULTURE, BLOOD (ROUTINE X 2)     ____________________________________________   EKG  ED ECG REPORT I, BROWN, [place] N, the attending physician, personally viewed and interpreted this ECG.   Date: 05/17/2015  EKG Time: 12:18 AM  Rate: 70  Rhythm: Normal Sinus rhythm  Axis: none  Intervals:normal  ST&T Change: none   ____________________________________________    RADIOLOGY  CT Head Wo Contrast (Final result) Result time: 05/17/15 02:19:22   Final result by Rad Results In Interface (05/17/15 02:19:22)   Narrative:   CLINICAL DATA: Acute onset of altered mental status. Multiple falls in the  past several months. Initial encounter.  EXAM: CT HEAD WITHOUT CONTRAST  TECHNIQUE: Contiguous axial images were obtained from the base of the skull through the vertex without intravenous contrast.  COMPARISON: CT of the head performed 03/12/2015  FINDINGS: There is no evidence of acute infarction, mass lesion, or intra- or extra-axial hemorrhage on CT.  Prominence of the ventricles and sulci reflects mild cortical volume loss. Mild cerebellar atrophy is noted. Mild periventricular white matter change likely reflects small vessel ischemic microangiopathy.  The brainstem and fourth ventricle are within normal limits. The basal ganglia are unremarkable in appearance. The cerebral hemispheres  demonstrate grossly normal gray-white differentiation. No mass effect or midline shift is seen.  There is no evidence of fracture; visualized osseous structures are unremarkable in appearance. The orbits are within normal limits. The paranasal sinuses and mastoid air cells are well-aerated. No significant soft tissue abnormalities are seen.  IMPRESSION: 1. No acute intracranial pathology seen on CT. 2. Mild cortical volume loss and scattered small vessel ischemic microangiopathy.   Electronically Signed By: Roanna Raider M.D. On: 05/17/2015 02:19          DG Chest 2 View (Final result) Result time: 05/17/15 16:10:96   Final result by Rad Results In Interface (05/17/15 02:02:28)   Narrative:   CLINICAL DATA: Acute onset of dyspnea and altered mental status. Significant upper chest and right arm bruising. Multiple falls in the past several months. Initial encounter.  EXAM: CHEST 2 VIEW  COMPARISON: Chest radiograph performed 03/12/2015  FINDINGS: The lungs are well-aerated. Mild bibasilar opacities may reflect atelectasis or possibly minimal pneumonia. There is no evidence of pleural effusion or pneumothorax.  The heart is borderline normal in size. No acute osseous abnormalities are seen.  IMPRESSION: Mild bibasilar opacities may reflect atelectasis or possibly minimal pneumonia.   Electronically Signed By: Roanna Raider M.D. On: 05/17/2015 02:02        ___________________________________   INITIAL IMPRESSION / ASSESSMENT AND PLAN / ED COURSE  Pertinent labs & imaging results that were available during my care of the patient were reviewed by me and considered in my medical decision making (see chart for details).  No clear etiology noted for the patient's altered mental status. Patient admitted to Dr. Sheryle Hail hospitalist on call for further evaluation and management  ____________________________________________   FINAL CLINICAL  IMPRESSION(S) / ED DIAGNOSES  Final diagnoses:  Altered mental status, unspecified altered mental status type      Darci Current, MD 05/17/15 [phone]

## 2015-05-17 NOTE — Care Management Obs Status (Signed)
MEDICARE OBSERVATION STATUS NOTIFICATION   Patient Details  Name: Victoria Johnston MRN: [id number] Date of Birth: 10/06/1923   Medicare Observation Status Notification Given:  Yes    Marily Memos, RN 05/17/2015, 10:23 AM

## 2015-05-17 NOTE — ED Notes (Signed)
Pt from home by EMS with C/O AMS by daughter. EMS states pt had finished 10 day antibiotic treatment for UTI and daughter called them for pt having increasing AMS since yesterday. Pt has significant bruising to upper chest, right arm, left arm and left leg. Pts daughter states pt has had numerous falls in the past few months. Pt denies pain at this time.

## 2015-05-17 NOTE — Progress Notes (Signed)
ANTICOAGULATION CONSULT NOTE - Initial Consult  Pharmacy Consult for Coumadin Indication: atrial fibrillation  Allergies  Allergen Reactions  . Codeine Nausea Only  . Lexapro [Escitalopram Oxalate] Other (See Comments)    Reaction: unknown  . Lyrica [Pregabalin] Other (See Comments)    Reaction: unknown  . Mysoline [Primidone] Other (See Comments)    Reaction: unknown    Patient Measurements: Height:  (160 cm) Weight: 178 lb 6.4 oz (80.922 kg) IBW/kg (Calculated) : 52.4 Heparin Dosing Weight: na  Vital Signs: Temp: 97.8 F (36.6 C) (01/18 1309) Temp Source: Oral (01/18 1309) BP: 120/51 mmHg (01/18 1309) Pulse Rate: 57 (01/18 1309)  Labs:  Recent Labs  05/17/15 0050 05/17/15 0810  HGB 11.5*  --   HCT 35.3  --   PLT 179  --   LABPROT  --  26.2*  INR  --  2.44  CREATININE 1.11*  --     Estimated Creatinine Clearance: 34.6 mL/min (by C-G formula based on Cr of 1.11).   Medical History: Past Medical History  Diagnosis Date  . Swelling   . Hearing loss   . Difficulty breathing   . Bruises easily   . Tremors of nervous system   . HLD (hyperlipidemia)   . Crohn disease (HCC)   . GERD (gastroesophageal reflux disease)   . Raynaud disease   . HTN (hypertension)   . OA (osteoarthritis)   . Chronic obstructive asthma (HCC)   . CHF (congestive heart failure) (HCC)   . DVT (deep vein thrombosis) in pregnancy   . Anxiety     Medications:  Prescriptions prior to admission  Medication Sig Dispense Refill Last Dose  . doxepin (SINEQUAN) 10 MG capsule Take 1 capsule by mouth daily.   05/16/2015 at Unknown time  . haloperidol (HALDOL) 1 MG tablet Take 1 tablet (1 mg total) by mouth at bedtime. 1 tablet 1 05/16/2015 at Unknown time  . Iron-Vitamin C (VITRON-C) 65-125 MG TABS Take 1 tablet by mouth daily.   05/16/2015 at Unknown time  . levothyroxine (SYNTHROID, LEVOTHROID) 75 MCG tablet Take 75 mcg by mouth daily before breakfast.   05/16/2015 at Unknown time  .  LORazepam (ATIVAN) 1 MG tablet Take 1 tablet (1 mg total) by mouth every 8 (eight) hours as needed for anxiety. 30 tablet 0 prn at prn  . nitroGLYCERIN (NITROSTAT) 0.4 MG SL tablet Place 0.4 mg under the tongue every 5 (five) minutes as needed for chest pain.   prn at prn  . pantoprazole (PROTONIX) 40 MG tablet Take 40 mg by mouth 2 (two) times daily.   05/16/2015 at Unknown time  . propranolol (INDERAL) 40 MG tablet Take 20 mg by mouth daily.    05/16/2015 at Unknown time  . topiramate (TOPAMAX) 100 MG tablet Take 50 mg by mouth at bedtime.    05/16/2015 at Unknown time  . warfarin (COUMADIN) 5 MG tablet Take 2.5-5 mg by mouth daily at 6 PM. 2.5mg  one day a week and  all other days   05/16/2015 at Unknown time  . acetaminophen (TYLENOL) 325 MG tablet Take 2 tablets (650 mg total) by mouth every 6 (six) hours as needed for mild pain (or Fever >/= 101). (Patient not taking: Reported on 03/12/2015) 1 tablet 1 Not Taking at Unknown time  . bisacodyl (DULCOLAX) 10 MG suppository Place 1 suppository (10 mg total) rectally daily as needed for moderate constipation. (Patient not taking: Reported on 03/12/2015) 12 suppository 0 Not Taking at Unknown time  .  cephALEXin (KEFLEX) 250 MG capsule Take 1 capsule (250 mg total) by mouth every 8 (eight) hours. (Patient not taking: Reported on 05/17/2015) 15 capsule 0 Completed Course at Unknown time  . docusate sodium (COLACE) 100 MG capsule Take 1 capsule (100 mg total) by mouth 2 (two) times daily. (Patient not taking: Reported on 05/17/2015) 10 capsule 0 Not Taking at Unknown time  . minocycline (MINOCIN,DYNACIN) 100 MG capsule Take 1 capsule (100 mg total) by mouth 2 (two) times daily. (Patient not taking: Reported on 03/12/2015) 14 capsule 0 Completed Course at Unknown time  . oxyCODONE (OXY IR/ROXICODONE) 5 MG immediate release tablet Take 1-2 tablets (5-10 mg total) by mouth every 4 (four) hours as needed for breakthrough pain ((for MODERATE breakthrough pain)).  (Patient not taking: Reported on 05/17/2015) 30 tablet 0 Not Taking at Unknown time  . senna (SENOKOT) 8.6 MG TABS tablet Take 1 tablet (8.6 mg total) by mouth 2 (two) times daily. (Patient not taking: Reported on 05/17/2015) 120 each 0 Not Taking at Unknown time   Scheduled:  . docusate sodium  100 mg Oral BID  . doxepin  10 mg Oral Daily  . ferrous sulfate  325 mg Oral Q breakfast   And  . vitamin C  250 mg Oral Q breakfast  . haloperidol  1 mg Oral QHS  . levothyroxine  75 mcg Oral QAC breakfast  . nystatin cream  1 application Topical TID  . pantoprazole  40 mg Oral BID  . propranolol  20 mg Oral Daily  . risperiDONE  0.5 mg Oral BID  . sodium chloride  3 mL Intravenous Q12H  . warfarin  5 mg Oral q1800  . Warfarin - Physician Dosing Inpatient   Does not apply q1800    Assessment: Patient is a 82yo female admitted for altered mental status. Was taking Coumadin 5mg  daily prior to admission for a-fib. Pharmacy consulted to manage.  1/18 INR=2.44  Goal of Therapy:  INR 2-3 Monitor platelets by anticoagulation protocol: Yes   Plan:  Will continue with Coumadin 5mg  daily. Will check daily INR while here.  Clovia Cuff, PharmD, BCPS 05/17/2015 2:59 PM

## 2015-05-17 NOTE — H&P (Signed)
Victoria Johnston is an 82 y.o. female.   Chief Complaint: Mental status HPI: Patient presents to the emergency department from home due to agitation. The patient's daughter states that her mom has had periods of time where she is more agitated and changes in her medication regimen has helped. Over the last few days however, she has been agitated without improvement. Laboratory evaluation in the emergency department as well as imaging of the patient's head did not reveal a particular cause of her altered mental status. She was given Ativan without improvement. Due to her continued agitation emergency department staff called for admission.  Past Medical History  Diagnosis Date  . Swelling   . Hearing loss   . Difficulty breathing   . Bruises easily   . Tremors of nervous system   . HLD (hyperlipidemia)   . Crohn disease ([place])   . GERD (gastroesophageal reflux disease)   . Raynaud disease   . HTN (hypertension)   . OA (osteoarthritis)   . Chronic obstructive asthma ([place])   . CHF (congestive heart failure) ([place])   . DVT (deep vein thrombosis) in pregnancy   . Anxiety     Past Surgical History  Procedure Laterality Date  . Abdominal hysterectomy    . Knee surgery Bilateral   . Foot surgery Bilateral   . Orif femur fracture Right 01/22/2015    Procedure: OPEN REDUCTION INTERNAL FIXATION (ORIF) DISTAL FEMUR FRACTURE;  Surgeon: Hessie Knows, MD;  Location: [facility] ORS;  Service: Orthopedics;  Laterality: Right;    Family History  Problem Relation Age of Onset  . CAD    . Diabetes    . Hypertension     Social History:  reports that she has never smoked. She has never used smokeless tobacco. She reports that she does not drink alcohol or use illicit drugs.  Allergies:  Allergies  Allergen Reactions  . Codeine Nausea Only  . Lexapro [Escitalopram Oxalate] Other (See Comments)    Reaction: unknown  . Lyrica [Pregabalin] Other (See Comments)    Reaction: unknown  . Mysoline  [Primidone] Other (See Comments)    Reaction: unknown    Prior to Admission medications   Medication Sig Start Date End Date Taking? Authorizing Provider  doxepin (SINEQUAN) 10 MG capsule Take 1 capsule by mouth daily. 05/05/15  Yes Historical Provider, MD  haloperidol (HALDOL) 1 MG tablet Take 1 tablet (1 mg total) by mouth at bedtime. 01/24/15  Yes Rusty Aus, MD  Iron-Vitamin C (VITRON-C) 65-125 MG TABS Take 1 tablet by mouth daily.   Yes Historical Provider, MD  levothyroxine (SYNTHROID, LEVOTHROID) 75 MCG tablet Take 75 mcg by mouth daily before breakfast.   Yes Historical Provider, MD  LORazepam (ATIVAN) 1 MG tablet Take 1 tablet (1 mg total) by mouth every 8 (eight) hours as needed for anxiety. 01/24/15  Yes Rusty Aus, MD  nitroGLYCERIN (NITROSTAT) 0.4 MG SL tablet Place 0.4 mg under the tongue every 5 (five) minutes as needed for chest pain.   Yes Historical Provider, MD  pantoprazole (PROTONIX) 40 MG tablet Take 40 mg by mouth 2 (two) times daily.   Yes Historical Provider, MD  propranolol (INDERAL) 40 MG tablet Take 20 mg by mouth daily.    Yes Historical Provider, MD  topiramate (TOPAMAX) 100 MG tablet Take 50 mg by mouth at bedtime.    Yes Historical Provider, MD  warfarin (COUMADIN) 5 MG tablet Take 2.5-5 mg by mouth daily at 6 PM. 2.66m one day  a week and 51m all other days   Yes Historical Provider, MD  acetaminophen (TYLENOL) 325 MG tablet Take 2 tablets (650 mg total) by mouth every 6 (six) hours as needed for mild pain (or Fever >/= 101). Patient not taking: Reported on 03/12/2015 01/24/15   MRusty Aus MD  bisacodyl (DULCOLAX) 10 MG suppository Place 1 suppository (10 mg total) rectally daily as needed for moderate constipation. Patient not taking: Reported on 03/12/2015 01/24/15   MRusty Aus MD  cephALEXin (KEFLEX) 250 MG capsule Take 1 capsule (250 mg total) by mouth every 8 (eight) hours. Patient not taking: Reported on 05/17/2015 03/14/15   RTheodoro Grist MD   docusate sodium (COLACE) 100 MG capsule Take 1 capsule (100 mg total) by mouth 2 (two) times daily. Patient not taking: Reported on 05/17/2015 01/24/15   MRusty Aus MD  minocycline (MINOCIN,DYNACIN) 100 MG capsule Take 1 capsule (100 mg total) by mouth 2 (two) times daily. Patient not taking: Reported on 03/12/2015 01/24/15   MRusty Aus MD  oxyCODONE (OXY IR/ROXICODONE) 5 MG immediate release tablet Take 1-2 tablets (5-10 mg total) by mouth every 4 (four) hours as needed for breakthrough pain ((for MODERATE breakthrough pain)). Patient not taking: Reported on 05/17/2015 01/24/15   MRusty Aus MD  senna (SENOKOT) 8.6 MG TABS tablet Take 1 tablet (8.6 mg total) by mouth 2 (two) times daily. Patient not taking: Reported on 05/17/2015 01/24/15   MRusty Aus MD     Results for orders placed or performed during the hospital encounter of 05/17/15 (from the past 48 hour(s))  [facility]inalysis [facility])     Status: Abnormal   Collection Time: 05/17/15 12:17 AM  Result Value Ref Range   Color, Urine YELLOW (A) YELLOW   APPearance CLEAR (A) CLEAR   Glucose, UA NEGATIVE NEGATIVE mg/dL   Bilirubin Urine NEGATIVE NEGATIVE   Ketones, ur TRACE (A) NEGATIVE mg/dL   Specific Gravity, Urine 1.006 1.005 - 1.030   Hgb urine dipstick 1+ (A) NEGATIVE   pH 5.0 5.0 - 8.0   Protein, ur NEGATIVE NEGATIVE mg/dL   Nitrite NEGATIVE NEGATIVE   Leukocytes, UA NEGATIVE NEGATIVE   RBC / HPF 0-5 0 - 5 RBC/hpf   WBC, UA 0-5 0 - 5 WBC/hpf   Bacteria, UA RARE (A) NONE SEEN   Squamous Epithelial / LPF 0-5 (A) NONE SEEN  CBC     Status: Abnormal   Collection Time: 05/17/15 12:50 AM  Result Value Ref Range   WBC 6.5 3.6 - 11.0 K/uL   RBC 3.87 3.80 - 5.20 MIL/uL   Hemoglobin 11.5 (L) 12.0 - 16.0 g/dL   HCT 35.3 35.0 - 47.0 %   MCV 91.1 80.0 - 100.0 fL   MCH 29.8 26.0 - 34.0 pg   MCHC 32.7 32.0 - 36.0 g/dL   RDW 14.8 (H) 11.5 - 14.5 %   Platelets 179 150 - 440 K/uL  Comprehensive  metabolic panel     Status: Abnormal   Collection Time: 05/17/15 12:50 AM  Result Value Ref Range   Sodium 140 135 - 145 mmol/L   Potassium 3.4 (L) 3.5 - 5.1 mmol/L   Chloride 112 (H) 101 - 111 mmol/L   CO2 23 22 - 32 mmol/L   Glucose, Bld 104 (H) 65 - 99 mg/dL   BUN 14 6 - 20 mg/dL   Creatinine, Ser 1.11 (H) 0.44 - 1.00 mg/dL   Calcium 8.5 (L) 8.9 - 10.3  mg/dL   Total Protein 5.6 (L) 6.5 - 8.1 g/dL   Albumin 2.8 (L) 3.5 - 5.0 g/dL   AST 14 (L) 15 - 41 U/L   ALT 8 (L) 14 - 54 U/L   Alkaline Phosphatase 83 38 - 126 U/L   Total Bilirubin 0.8 0.3 - 1.2 mg/dL   GFR calc non Af Amer 43 (L) >60 mL/min   GFR calc Af Amer 50 (L) >60 mL/min    Comment: (NOTE) The eGFR has been calculated using the CKD EPI equation. This calculation has not been validated in all clinical situations. eGFR's persistently <60 mL/min signify possible Chronic Kidney Disease.    Anion gap 5 5 - 15  TSH     Status: None   Collection Time: 05/17/15 12:50 AM  Result Value Ref Range   TSH 0.498 0.350 - 4.500 uIU/mL   Dg Chest 2 View  05/17/2015  CLINICAL DATA:  Acute onset of dyspnea and altered mental status. Significant upper chest and right arm bruising. Multiple falls in the past several months. Initial encounter. EXAM: CHEST  2 VIEW COMPARISON:  Chest radiograph performed 03/12/2015 FINDINGS: The lungs are well-aerated. Mild bibasilar opacities may reflect atelectasis or possibly minimal pneumonia. There is no evidence of pleural effusion or pneumothorax. The heart is borderline normal in size. No acute osseous abnormalities are seen. IMPRESSION: Mild bibasilar opacities may reflect atelectasis or possibly minimal pneumonia. Electronically Signed   By: Garald Balding M.D.   On: 05/17/2015 02:02   Ct Head Wo Contrast  05/17/2015  CLINICAL DATA:  Acute onset of altered mental status. Multiple falls in the past several months. Initial encounter. EXAM: CT HEAD WITHOUT CONTRAST TECHNIQUE: Contiguous axial images were  obtained from the base of the skull through the vertex without intravenous contrast. COMPARISON:  CT of the head performed 03/12/2015 FINDINGS: There is no evidence of acute infarction, mass lesion, or intra- or extra-axial hemorrhage on CT. Prominence of the ventricles and sulci reflects mild cortical volume loss. Mild cerebellar atrophy is noted. Mild periventricular white matter change likely reflects small vessel ischemic microangiopathy. The brainstem and fourth ventricle are within normal limits. The basal ganglia are unremarkable in appearance. The cerebral hemispheres demonstrate grossly normal gray-white differentiation. No mass effect or midline shift is seen. There is no evidence of fracture; visualized osseous structures are unremarkable in appearance. The orbits are within normal limits. The paranasal sinuses and mastoid air cells are well-aerated. No significant soft tissue abnormalities are seen. IMPRESSION: 1. No acute intracranial pathology seen on CT. 2. Mild cortical volume loss and scattered small vessel ischemic microangiopathy. Electronically Signed   By: Garald Balding M.D.   On: 05/17/2015 02:19    Review of Systems  Unable to perform ROS: dementia   the patient denies pain anywhere  Blood pressure 127/60, pulse 84, temperature 98.7 F (37.1 C), temperature source Oral, resp. rate 15, height 5' 3"  (1.6 m), weight 90.719 kg (200 lb), SpO2 99 %. Physical Exam  Nursing note and vitals reviewed. Constitutional: She appears well-developed and well-nourished. No distress.  HENT:  Head: Normocephalic and atraumatic.  Mouth/Throat: Oropharynx is clear and moist.  Eyes: Conjunctivae and EOM are normal. Pupils are equal, round, and reactive to light.  Neck: Normal range of motion. Neck supple. No JVD present. No tracheal deviation present. No thyromegaly present.  Cardiovascular: Normal rate, regular rhythm and normal heart sounds.  Exam reveals no gallop and no friction rub.   No  murmur heard. Respiratory: Effort  normal and breath sounds normal.  GI: Soft. Bowel sounds are normal. She exhibits no distension. There is no tenderness.  Genitourinary:  Deferred  Musculoskeletal: Normal range of motion. She exhibits edema.  Lymphadenopathy:    She has no cervical adenopathy.  Neurological: She is alert. No cranial nerve deficit. She exhibits normal muscle tone.  Agitated and combative  Skin: Skin is warm and dry. No rash noted. No erythema.  Psychiatric: She has a normal mood and affect. Her behavior is normal. Judgment and thought content normal.     Assessment/Plan This is an 82 year old female admitted for agitation. 1. Altered mental status: Agitation. Etiology is unclear at this time. The patient denies pain. There is no urinary tract infection. She has chronic kidney disease and mild hypokalemia but this would not explain her altered mental status. This may be a decline in her dementia. Also, the patient takes Topamax. Originally this medication was prescribed for headaches but is now used to address her essential tremor. Perhaps another antiepileptic would help with her movement disorder as well as sedation. I will use Haldol for sedation for now. Consider neurology consult. 2. Essential hypertension: Continue propranolol (this may also be used for tremor) 3. Congestive heart failure: Possibly diastolic; chronic. Stable 4. Hypothyroidism: Continue Synthroid. Recheck TSH as inadequate dosing of thyroid replacement hormone could cause acute mental status changes 5. Systemic anticoagulation: Continue warfarin (presumably for chronic DVT) 6. DVT prophylaxis: As above 7. GI prophylaxis: Pantoprazole per home regimen The patient is a full code. Time spent on admission orders and patient care approximately 45 minutes. **Addendum: Upon review of patient's outpatient medical record she had a urinalysis yesterday at her primary care office that showed positive urinary tract  infection. I will give the patient ceftriaxone now. Hopefully with improvement we may switch her to oral antibiotics.  Harrie Foreman 05/17/2015, 6:44 AM

## 2015-05-17 NOTE — Progress Notes (Signed)
Orders to d/c tele and pt to transfer to [facility]. Report called and pt escorted off unit by transport.

## 2015-05-18 DIAGNOSIS — F0390 Unspecified dementia without behavioral disturbance: Secondary | ICD-10-CM | POA: Diagnosis not present

## 2015-05-18 LAB — VITAMIN B12: Vitamin B-12: 1293 pg/mL — ABNORMAL HIGH (ref 180–914)

## 2015-05-18 LAB — PROTIME-INR
INR: 2.46
Prothrombin Time: 26.4 seconds — ABNORMAL HIGH (ref 11.4–15.0)

## 2015-05-18 LAB — GLUCOSE, CAPILLARY: Glucose-Capillary: 96 mg/dL (ref 65–99)

## 2015-05-18 NOTE — Progress Notes (Signed)
ANTICOAGULATION CONSULT NOTE - Follow Up Consult  Pharmacy Consult for Coumadin Indication: atrial fibrillation  Allergies  Allergen Reactions  . Codeine Nausea Only  . Lexapro [Escitalopram Oxalate] Other (See Comments)    Reaction: unknown  . Lyrica [Pregabalin] Other (See Comments)    Reaction: unknown  . Mysoline [Primidone] Other (See Comments)    Reaction: unknown    Patient Measurements: Height:  (160 cm) Weight: 180 lb 3.2 oz (81.738 kg) IBW/kg (Calculated) : 52.4 Heparin Dosing Weight: na  Vital Signs: Temp: 98.8 F (37.1 C) (01/19 0718) Temp Source: Oral (01/19 0718) BP: 157/71 mmHg (01/19 0718) Pulse Rate: 77 (01/19 0718)  Labs:  Recent Labs  05/17/15 0050 05/17/15 0810 05/18/15 0435  HGB 11.5*  --   --   HCT 35.3  --   --   PLT 179  --   --   LABPROT  --  26.2* 26.4*  INR  --  2.44 2.46  CREATININE 1.11*  --   --     Estimated Creatinine Clearance: 34.8 mL/min (by C-G formula based on Cr of 1.11).   Medical History: Past Medical History  Diagnosis Date  . Swelling   . Hearing loss   . Difficulty breathing   . Bruises easily   . Tremors of nervous system   . HLD (hyperlipidemia)   . Crohn disease ([facility])   . GERD (gastroesophageal reflux disease)   . Raynaud disease   . HTN (hypertension)   . OA (osteoarthritis)   . Chronic obstructive asthma ([facility])   . CHF (congestive heart failure) ([facility])   . DVT (deep vein thrombosis) in pregnancy   . Anxiety     Medications:  Prescriptions prior to admission  Medication Sig Dispense Refill Last Dose  . doxepin (SINEQUAN) 10 MG capsule Take 1 capsule by mouth daily.   05/16/2015 at Unknown time  . haloperidol (HALDOL) 1 MG tablet Take 1 tablet (1 mg total) by mouth at bedtime. 1 tablet 1 05/16/2015 at Unknown time  . Iron-Vitamin C (VITRON-C) 65-125 MG TABS Take 1 tablet by mouth daily.   05/16/2015 at Unknown time  . levothyroxine (SYNTHROID, LEVOTHROID) 75 MCG tablet Take 75 mcg by mouth daily  before breakfast.   05/16/2015 at Unknown time  . LORazepam (ATIVAN) 1 MG tablet Take 1 tablet (1 mg total) by mouth every 8 (eight) hours as needed for anxiety. 30 tablet 0 prn at prn  . nitroGLYCERIN (NITROSTAT) 0.4 MG SL tablet Place 0.4 mg under the tongue every 5 (five) minutes as needed for chest pain.   prn at prn  . pantoprazole (PROTONIX) 40 MG tablet Take 40 mg by mouth 2 (two) times daily.   05/16/2015 at Unknown time  . propranolol (INDERAL) 40 MG tablet Take 20 mg by mouth daily.    05/16/2015 at Unknown time  . topiramate (TOPAMAX) 100 MG tablet Take 50 mg by mouth at bedtime.    05/16/2015 at Unknown time  . warfarin (COUMADIN) 5 MG tablet Take 2.5-5 mg by mouth daily at 6 PM. 2.5mg  one day a week and  all other days   05/16/2015 at Unknown time  . acetaminophen (TYLENOL) 325 MG tablet Take 2 tablets (650 mg total) by mouth every 6 (six) hours as needed for mild pain (or Fever >/= 101). (Patient not taking: Reported on 03/12/2015) 1 tablet 1 Not Taking at Unknown time  . bisacodyl (DULCOLAX) 10 MG suppository Place 1 suppository (10 mg total) rectally daily as needed  for moderate constipation. (Patient not taking: Reported on 03/12/2015) 12 suppository 0 Not Taking at Unknown time  . cephALEXin (KEFLEX) 250 MG capsule Take 1 capsule (250 mg total) by mouth every 8 (eight) hours. (Patient not taking: Reported on 05/17/2015) 15 capsule 0 Completed Course at Unknown time  . docusate sodium (COLACE) 100 MG capsule Take 1 capsule (100 mg total) by mouth 2 (two) times daily. (Patient not taking: Reported on 05/17/2015) 10 capsule 0 Not Taking at Unknown time  . minocycline (MINOCIN,DYNACIN) 100 MG capsule Take 1 capsule (100 mg total) by mouth 2 (two) times daily. (Patient not taking: Reported on 03/12/2015) 14 capsule 0 Completed Course at Unknown time  . oxyCODONE (OXY IR/ROXICODONE) 5 MG immediate release tablet Take 1-2 tablets (5-10 mg total) by mouth every 4 (four) hours as needed for  breakthrough pain ((for MODERATE breakthrough pain)). (Patient not taking: Reported on 05/17/2015) 30 tablet 0 Not Taking at Unknown time  . senna (SENOKOT) 8.6 MG TABS tablet Take 1 tablet (8.6 mg total) by mouth 2 (two) times daily. (Patient not taking: Reported on 05/17/2015) 120 each 0 Not Taking at Unknown time   Scheduled:  . docusate sodium  100 mg Oral BID  . doxepin  10 mg Oral Daily  . ferrous sulfate  325 mg Oral Q breakfast   And  . vitamin C  250 mg Oral Q breakfast  . haloperidol  1 mg Oral QHS  . levothyroxine  75 mcg Oral QAC breakfast  . nystatin cream  1 application Topical TID  . pantoprazole  40 mg Oral BID  . propranolol  20 mg Oral Daily  . risperiDONE  0.5 mg Oral BID  . sodium chloride  3 mL Intravenous Q12H  . warfarin  5 mg Oral q1800    Assessment: Patient is a 82yo female admitted for altered mental status. Was taking Coumadin 5mg  daily prior to admission for a-fib. Pharmacy consulted to manage.  1/18 INR=2.44, warfarin 5 mg 1/19 INR=2.46  Goal of Therapy:  INR 2-3 Monitor platelets by anticoagulation protocol: Yes   Plan:  INR within goal range. Will continue with Coumadin 5mg  daily. Will check daily INR while here.  Pharmacy will continue to follow.   Clarisa Schools, PharmD Clinical Pharmacist 05/18/2015

## 2015-05-18 NOTE — Progress Notes (Signed)
May change sitter order to PRN per Dr Nemiah Commander

## 2015-05-18 NOTE — Plan of Care (Signed)
Problem: Bowel/Gastric: Goal: Will not experience complications related to bowel motility Outcome: Progressing Pt alert to self. No c/o of pain nor distress noted. On iv fluids. Incontinent for both. Continue to monitor.

## 2015-05-18 NOTE — NC FL2 (Deleted)
[place] MEDICAID FL2 LEVEL OF CARE SCREENING TOOL     IDENTIFICATION  Patient Name: Victoria Johnston Birthdate: 11-29-1923 Sex: female Admission Date (Current Location): 05/17/2015  [place] and [place] Number:  Chiropodist and Address:  [facility], [address] [place], [place] [address]      Provider Number: [id number]  Attending Physician Name and Address:  Enid Baas, MD  Relative Name and Phone Number:       Current Level of Care: Hospital Recommended Level of Care: Skilled Nursing Facility Prior Approval Number:    Date Approved/Denied:   PASRR Number: [id number]  Discharge Plan: SNF    Current Diagnoses: Patient Active Problem List   Diagnosis Date Noted  . Agitation 05/17/2015  . Acute cystitis without hematuria 03/14/2015  . Right leg pain 03/14/2015  . Fall 03/12/2015  . Elevated troponin 03/12/2015  . Anxiety 03/12/2015  . CHF (congestive heart failure) ([facility]) 03/12/2015  . Chronic obstructive asthma ([facility]) 03/12/2015  . GERD (gastroesophageal reflux disease) 03/12/2015  . HTN (hypertension) 03/12/2015  . Hypothyroidism 03/12/2015  . Femur fracture ([facility]) 01/20/2015    Orientation RESPIRATION BLADDER Height & Weight    Self  Normal Incontinent 5\' 3"  (160 cm) 180 lbs.  BEHAVIORAL SYMPTOMS/MOOD NEUROLOGICAL BOWEL NUTRITION STATUS      Incontinent Diet (Heart Healthy, Thin Liquids)  AMBULATORY STATUS COMMUNICATION OF NEEDS Skin   Extensive Assist Verbally Normal                       Personal Care Assistance Level of Assistance  Bathing, Feeding, Dressing Bathing Assistance: Maximum assistance Feeding assistance: Maximum assistance Dressing Assistance: Maximum assistance     Functional Limitations Info  Sight, Hearing, Speech Sight Info: Adequate Hearing Info: Adequate Speech Info: Adequate    SPECIAL CARE FACTORS FREQUENCY                       Contractures      Additional  Factors Info  Code Status, Allergies Code Status Info: Full Code Allergies Info: Allergies: Codeine, Lexapro, Lyrica, Mysoline           Current Medications (05/18/2015):  This is the current hospital active medication list Current Facility-Administered Medications  Medication Dose Route Frequency Provider Last Rate Last Dose  . 0.9 %  sodium chloride infusion   Intravenous Continuous Enid Baas, MD 60 mL/hr at 05/18/15 0328    . acetaminophen (TYLENOL) tablet 650 mg  650 mg Oral Q6H PRN Arnaldo Natal, MD       Or  . acetaminophen (TYLENOL) suppository 650 mg  650 mg Rectal Q6H PRN Arnaldo Natal, MD      . docusate sodium (COLACE) capsule 100 mg  100 mg Oral BID Arnaldo Natal, MD   100 mg at 05/18/15 1016  . doxepin (SINEQUAN) capsule 10 mg  10 mg Oral Daily Arnaldo Natal, MD   10 mg at 05/18/15 1016  . ferrous sulfate tablet 325 mg  325 mg Oral Q breakfast Arnaldo Natal, MD   325 mg at 05/18/15 1017   And  . vitamin C (ASCORBIC ACID) tablet 250 mg  250 mg Oral Q breakfast Arnaldo Natal, MD   250 mg at 05/18/15 1016  . haloperidol lactate (HALDOL) injection 1 mg  1 mg Intravenous Q6H PRN Enid Baas, MD   1 mg at 05/18/15 1103  . levothyroxine (SYNTHROID, LEVOTHROID) tablet 75 mcg  75 mcg Oral QAC breakfast Arnaldo Natal, MD   75 mcg at 05/18/15 0545  . LORazepam (ATIVAN) injection 1-2 mg  1-2 mg Intravenous Q4H PRN Enid Baas, MD      . nitroGLYCERIN (NITROSTAT) SL tablet 0.4 mg  0.4 mg Sublingual Q5 min PRN Arnaldo Natal, MD      . nystatin cream (MYCOSTATIN) 1 application  1 application Topical TID Darci Current, MD   1 application at 05/18/15 1017  . ondansetron (ZOFRAN) tablet 4 mg  4 mg Oral Q6H PRN Arnaldo Natal, MD       Or  . ondansetron [facility]) injection 4 mg  4 mg Intravenous Q6H PRN Arnaldo Natal, MD      . pantoprazole (PROTONIX) EC tablet 40 mg  40 mg Oral BID Arnaldo Natal, MD   40 mg at 05/18/15 1016  .  propranolol (INDERAL) tablet 20 mg  20 mg Oral Daily Arnaldo Natal, MD   20 mg at 05/18/15 1016  . risperiDONE (RISPERDAL M-TABS) disintegrating tablet 0.5 mg  0.5 mg Oral BID Enid Baas, MD   0.5 mg at 05/18/15 1016  . sodium chloride 0.9 % injection 3 mL  3 mL Intravenous Q12H Arnaldo Natal, MD   3 mL at 05/18/15 1018  . warfarin (COUMADIN) tablet 5 mg  5 mg Oral q1800 Arnaldo Natal, MD   5 mg at 05/17/15 1819     Discharge Medications: Please see discharge summary for a list of discharge medications.  Relevant Imaging Results:  Relevant Lab Results:   Additional Information SSN:  [id number]  Dede Query, LCSW

## 2015-05-18 NOTE — Clinical Social Work Placement (Signed)
   CLINICAL SOCIAL WORK PLACEMENT  NOTE  Date:  05/18/2015  Patient Details  Name: Victoria Johnston MRN: [id number] Date of Birth: March 14, 1924  Clinical Social Work is seeking post-discharge placement for this patient at the Skilled  Nursing Facility level of care (*CSW will initial, date and re-position this form in  chart as items are completed):  Yes   Patient/family provided with [facility] Clinical Social Work Department's list of facilities offering this level of care within the geographic area requested by the patient (or if unable, by the patient's family).  Yes   Patient/family informed of their freedom to choose among providers that offer the needed level of care, that participate in Medicare, Medicaid or managed care program needed by the patient, have an available bed and are willing to accept the patient.  Yes   Patient/family informed of [facility]'s ownership interest in [facility] and [facility], as well as of the fact that they are under no obligation to receive care at these facilities.  PASRR submitted to EDS on       PASRR number received on       Existing PASRR number confirmed on 05/18/15     FL2 transmitted to all facilities in geographic area requested by pt/family on 05/18/15     FL2 transmitted to all facilities within larger geographic area on       Patient informed that his/her managed care company has contracts with or will negotiate with certain facilities, including the following:            Patient/family informed of bed offers received.  Patient chooses bed at       Physician recommends and patient chooses bed at      Patient to be transferred to   on  .  Patient to be transferred to facility by       Patient family notified on   of transfer.  Name of family member notified:        PHYSICIAN       Additional Comment:    _______________________________________________ Dede Query, LCSW 05/18/2015, 4:53 PM

## 2015-05-18 NOTE — Progress Notes (Signed)
[facility] - [place] at [facility]   PATIENT NAME: Victoria Johnston    MR#:  [id number]  DATE OF BIRTH:  23-Feb-1924  SUBJECTIVE:  CHIEF COMPLAINT:   Chief Complaint  Patient presents with  . Altered Mental Status   - pleasantly confused today, less agitated. Sitter at bedside - not oriented, but engages in conversation - sitter at bedside  REVIEW OF SYSTEMS:  Review of Systems  Unable to perform ROS: dementia    DRUG ALLERGIES:   Allergies  Allergen Reactions  . Codeine Nausea Only  . Lexapro [Escitalopram Oxalate] Other (See Comments)    Reaction: unknown  . Lyrica [Pregabalin] Other (See Comments)    Reaction: unknown  . Mysoline [Primidone] Other (See Comments)    Reaction: unknown    VITALS:  Blood pressure 157/71, pulse 77, temperature 98.8 F (37.1 C), temperature source Oral, resp. rate 18, height 5\' 3"  (1.6 m), weight 81.738 kg (180 lb 3.2 oz), SpO2 97 %.  PHYSICAL EXAMINATION:  Physical Exam  GENERAL:  82 y.o.-year-old patient lying in the bed with no acute distress.  EYES: Pupils equal, round, reactive to light and accommodation. No scleral icterus. Extraocular muscles intact.  HEENT: Head atraumatic, normocephalic. Oropharynx and nasopharynx clear.  NECK:  Supple, no jugular venous distention. No thyroid enlargement, no tenderness.  LUNGS: Normal breath sounds bilaterally, no wheezing, rales,rhonchi or crepitation. No use of accessory muscles of respiration.  CARDIOVASCULAR: S1, S2 normal. No rubs, or gallops. 3/6 systolic murmur present. ABDOMEN: Soft, nontender, nondistended. Bowel sounds present. No organomegaly or mass.  EXTREMITIES: No pedal edema, cyanosis, or clubbing.  NEUROLOGIC: No cranial nerve deficits. Following simple commands. Sensation intact. Gait not checked.  PSYCHIATRIC: The patient is alert and oriented to self.  SKIN: No obvious rash, lesion, or ulcer.    LABORATORY PANEL:   CBC  Recent Labs Lab  05/17/15 0050  WBC 6.5  HGB 11.5*  HCT 35.3  PLT 179   ------------------------------------------------------------------------------------------------------------------  Chemistries   Recent Labs Lab 05/17/15 0050  NA 140  K 3.4*  CL 112*  CO2 23  GLUCOSE 104*  BUN 14  CREATININE 1.11*  CALCIUM 8.5*  AST 14*  ALT 8*  ALKPHOS 83  BILITOT 0.8   ------------------------------------------------------------------------------------------------------------------  Cardiac Enzymes No results for input(s): TROPONINI in the last 168 hours. ------------------------------------------------------------------------------------------------------------------  RADIOLOGY:  Dg Chest 2 View  05/17/2015  CLINICAL DATA:  Acute onset of dyspnea and altered mental status. Significant upper chest and right arm bruising. Multiple falls in the past several months. Initial encounter. EXAM: CHEST  2 VIEW COMPARISON:  Chest radiograph performed 03/12/2015 FINDINGS: The lungs are well-aerated. Mild bibasilar opacities may reflect atelectasis or possibly minimal pneumonia. There is no evidence of pleural effusion or pneumothorax. The heart is borderline normal in size. No acute osseous abnormalities are seen. IMPRESSION: Mild bibasilar opacities may reflect atelectasis or possibly minimal pneumonia. Electronically Signed   By: Roanna Raider M.D.   On: 05/17/2015 02:02   Ct Head Wo Contrast  05/17/2015  CLINICAL DATA:  Acute onset of altered mental status. Multiple falls in the past several months. Initial encounter. EXAM: CT HEAD WITHOUT CONTRAST TECHNIQUE: Contiguous axial images were obtained from the base of the skull through the vertex without intravenous contrast. COMPARISON:  CT of the head performed 03/12/2015 FINDINGS: There is no evidence of acute infarction, mass lesion, or intra- or extra-axial hemorrhage on CT. Prominence of the ventricles and sulci reflects mild cortical volume loss. Mild  cerebellar atrophy is noted. Mild periventricular white matter change likely reflects small vessel ischemic microangiopathy. The brainstem and fourth ventricle are within normal limits. The basal ganglia are unremarkable in appearance. The cerebral hemispheres demonstrate grossly normal gray-white differentiation. No mass effect or midline shift is seen. There is no evidence of fracture; visualized osseous structures are unremarkable in appearance. The orbits are within normal limits. The paranasal sinuses and mastoid air cells are well-aerated. No significant soft tissue abnormalities are seen. IMPRESSION: 1. No acute intracranial pathology seen on CT. 2. Mild cortical volume loss and scattered small vessel ischemic microangiopathy. Electronically Signed   By: Roanna Raider M.D.   On: 05/17/2015 02:19   US Venous Img Lower Bilateral  05/17/2015  CLINICAL DATA:  Bilateral lower extremity swelling. EXAM: BILATERAL LOWER EXTREMITY VENOUS DOPPLER ULTRASOUND TECHNIQUE: Gray-scale sonography with graded compression, as well as color Doppler and duplex ultrasound were performed to evaluate the lower extremity deep venous systems from the level of the common femoral vein and including the common femoral, femoral, profunda femoral, popliteal and calf veins including the posterior tibial, peroneal and gastrocnemius veins when visible. The superficial great saphenous vein was also interrogated. Spectral Doppler was utilized to evaluate flow at rest and with distal augmentation maneuvers in the common femoral, femoral and popliteal veins. COMPARISON:  None. FINDINGS: Limited exam because of body habitus and peripheral edema. RIGHT LOWER EXTREMITY Common Femoral Vein: No evidence of thrombus. Normal compressibility, respiratory phasicity and response to augmentation. Saphenofemoral Junction: No evidence of thrombus. Normal compressibility and flow on color Doppler imaging. Profunda Femoral Vein: No evidence of thrombus.  Normal compressibility and flow on color Doppler imaging. Femoral Vein: No evidence of thrombus. Normal compressibility, respiratory phasicity and response to augmentation. Popliteal Vein: No evidence of thrombus. Normal compressibility, respiratory phasicity and response to augmentation. Calf Veins: No evidence of thrombus. Normal compressibility and flow on color Doppler imaging. Superficial Great Saphenous Vein: No evidence of thrombus. Normal compressibility and flow on color Doppler imaging. Venous Reflux:  None. Other Findings:  None. LEFT LOWER EXTREMITY Common Femoral Vein: No evidence of thrombus. Normal compressibility, respiratory phasicity and response to augmentation. Saphenofemoral Junction: No evidence of thrombus. Normal compressibility and flow on color Doppler imaging. Profunda Femoral Vein: No evidence of thrombus. Normal compressibility and flow on color Doppler imaging. Femoral Vein: No evidence of thrombus. Normal compressibility, respiratory phasicity and response to augmentation. Popliteal Vein: No evidence of thrombus. Normal compressibility, respiratory phasicity and response to augmentation. Calf Veins: No evidence of thrombus. Normal compressibility and flow on color Doppler imaging. Superficial Great Saphenous Vein: No evidence of thrombus. Normal compressibility and flow on color Doppler imaging. Venous Reflux:  None. Other Findings:  None. IMPRESSION: No evidence of significant occlusive deep venous thrombosis in either extremity. Electronically Signed   By: Judie Petit.  Shick M.D.   On: 05/17/2015 16:22    EKG:   Orders placed or performed during the hospital encounter of 05/17/15  . ED EKG  . ED EKG  . EKG 12-Lead  . EKG 12-Lead    ASSESSMENT AND PLAN:   82 year old female with past medical history significant for DVT on Coumadin from June 2015, Raynaud's disease, GERD, hypertension, CHF and asthma was brought into the hospital secondary to mental status changes.  #1 altered  mental status-with significant agitation. Some improvement today. -Likely from acute delirium vs dementia.  - cont sitter prn. Continue to monitor. No focal neuro deficits. Likely has underlying dementia. -She was just seen by her  PCP about 2 and half weeks ago for similar complaints. She was started on Ativan and also Haldol and was advised to follow up in 3 weeks. -No evidence of any infection noted at this time. But 3 weeks ago had multidrug resistant E.coli and treated with macrobid- UA now appears improved- so rocephin discontinued. -CT of the head without any significant findings other than chronic small vessel ischemic changes. Also has significant tremors noted.  -Started on risperidone twice a day. Also use Ativan and Haldol when necessary. -Might need placement. -Discussed with on-call neurologist, if no improvement in mental status-consult neurology tomorrow  #2 essential tremors-on  propranolol. Discontinued Topamax  #3 history of DVT in 2015-currently on Coumadin. Pharmacy consulted for the same. -Due to recent history of falls, not sure if she would be an appropriate candidate for long-term anticoagulation. -Dopplers ordered of the lower extremities and they're negative for DVT. Discussed with daughter and discontinue Coumadin at discharge.  #4 GERD-on Protonix  #5 hypothyroidism-on Synthroid  #6 DVT prophylaxis-on Coumadin  Physical therapy consulted.   All the records are reviewed and case discussed with Care Management/Social Workerr. Management plans discussed with the patient, family and they are in agreement.  CODE STATUS: Full Code  TOTAL TIME TAKING CARE OF THIS PATIENT: 36 minutes.   POSSIBLE D/C IN 2 DAYS, DEPENDING ON CLINICAL CONDITION.   Enid Baas M.D on 05/18/2015 at 11:07 AM  Between 7am to 6pm - Pager - [phone]  After 6pm go to [email or web address] - password [email or web address] [facility]  [facility]  Office  [phone]  CC: Primary care  physician; Danella Penton., MD

## 2015-05-18 NOTE — Clinical Social Work Note (Signed)
Clinical Social Work Assessment  Patient Details  Name: Victoria Johnston MRN: [id number] Date of Birth: 01-Aug-1923  Date of referral:  05/18/15               Reason for consult:  Facility Placement                Permission sought to share information with:  Family Supports Permission granted to share information::  Yes, Verbal Permission Granted  Name::     Angelita Harnack, daughter   Housing/Transportation Living arrangements for the past 2 months:  [place] of Information:  Adult Children Patient Interpreter Needed:  None Criminal Activity/Legal Involvement Pertinent to Current Situation/Hospitalization:  No - Comment as needed Significant Relationships:  Adult Children Lives with:  Adult Children Do you feel safe going back to the place where you live?  Yes Need for family participation in patient care:  Yes (Comment)  Care giving concerns:  Pt's daughter shared that she is unable to care for pt at home and pt is unable to stay by herself.   Social Worker assessment / plan:  CSW met with pt's daughter per daughter request. CSW introduced herself and explained role of social work. CSW also explained the process of discharging to SNF under Medicare OBS. Per pt's daughter, Medicaid application has been started and they have to spend down. Pt's daughter is willing and able to pay privately for a short time. Pt has been to [facility] in the past. CSW contacted facility to inquire about private pay options.CSW inittiated SNF search and will follow up with bed offers.   Employment status:  Retired Forensic scientist:  Medicare PT Recommendations:  Not assessed at this time [place] / Referral to community resources:  [place], Other (Comment Required) (DSS )  Patient/Family's Response to care:  Pt's daughter was appreciative of CSW support.   Patient/Family's Understanding of and Emotional Response to Diagnosis, Current Treatment, and Prognosis:   Pt's daughter understands that pt would benefit from a LTC at [facility].   Emotional Assessment Appearance:  Appears stated age Attitude/Demeanor/Rapport:  Unable to Assess Affect (typically observed):  Unable to Assess Orientation:  Oriented to Self Alcohol / Substance use:  Never Used Psych involvement (Current and /or in the community):  No (Comment)  Discharge Needs  Concerns to be addressed:  Adjustment to Illness Readmission within the last 30 days:  No Current discharge risk:  Chronically ill Barriers to Discharge:  Continued Medical Work up   Darden Dates, LCSW 05/18/2015, 4:56 PM

## 2015-05-19 DIAGNOSIS — F0390 Unspecified dementia without behavioral disturbance: Secondary | ICD-10-CM | POA: Diagnosis not present

## 2015-05-19 LAB — CBC
HCT: 31.7 % — ABNORMAL LOW (ref 35.0–47.0)
Hemoglobin: 10.2 g/dL — ABNORMAL LOW (ref 12.0–16.0)
MCH: 28.9 pg (ref 26.0–34.0)
MCHC: 32.3 g/dL (ref 32.0–36.0)
MCV: 89.7 fL (ref 80.0–100.0)
PLATELETS: 163 10*3/uL (ref 150–440)
RBC: 3.53 MIL/uL — ABNORMAL LOW (ref 3.80–5.20)
RDW: 15.1 % — AB (ref 11.5–14.5)
WBC: 4.5 10*3/uL (ref 3.6–11.0)

## 2015-05-19 LAB — BASIC METABOLIC PANEL
ANION GAP: 5 (ref 5–15)
BUN: 7 mg/dL (ref 6–20)
CALCIUM: 7.7 mg/dL — AB (ref 8.9–10.3)
CO2: 22 mmol/L (ref 22–32)
Chloride: 111 mmol/L (ref 101–111)
Creatinine, Ser: 0.82 mg/dL (ref 0.44–1.00)
GLUCOSE: 93 mg/dL (ref 65–99)
Potassium: 3 mmol/L — ABNORMAL LOW (ref 3.5–5.1)
Sodium: 138 mmol/L (ref 135–145)

## 2015-05-19 LAB — PROTIME-INR
INR: 2.59
Prothrombin Time: 27.4 seconds — ABNORMAL HIGH (ref 11.4–15.0)

## 2015-05-19 MED ORDER — RISPERIDONE 0.5 MG PO TBDP: 0.5000 mg | ORAL_TABLET | Freq: Two times a day (BID) | ORAL | Status: DC

## 2015-05-19 MED ORDER — POTASSIUM CHLORIDE 20 MEQ/15ML (10%) PO SOLN
40.0000 meq | Freq: Once | ORAL | Status: DC
  Filled 2015-05-19: qty 30

## 2015-05-19 MED ORDER — HALOPERIDOL 1 MG PO TABS: 1.0000 mg | ORAL_TABLET | Freq: Four times a day (QID) | ORAL | Status: DC | PRN

## 2015-05-19 MED ORDER — POTASSIUM CHLORIDE 10 MEQ/100ML IV SOLN
10.0000 meq | INTRAVENOUS | Status: AC
  Administered 2015-05-19 (×2): 10 meq via INTRAVENOUS
  Filled 2015-05-19 (×2): qty 100

## 2015-05-19 NOTE — NC FL2 (Signed)
[place] MEDICAID FL2 LEVEL OF CARE SCREENING TOOL     IDENTIFICATION  Patient Name: Victoria Johnston Birthdate: 02-21-1924 Sex: female Admission Date (Current Location): 05/17/2015  [place] and [place] Number:  Chiropodist and Address:  [facility], [address] [place], [place] [address]      Provider Number: [id number]  Attending Physician Name and Address:  Adrian Saran, MD  Relative Name and Phone Number:       Current Level of Care: Hospital Recommended Level of Care: Skilled Nursing Facility Prior Approval Number:    Date Approved/Denied:   PASRR Number: [id number]  Discharge Plan: SNF    Current Diagnoses: Patient Active Problem List   Diagnosis Date Noted  . Agitation 05/17/2015  . Acute cystitis without hematuria 03/14/2015  . Right leg pain 03/14/2015  . Fall 03/12/2015  . Elevated troponin 03/12/2015  . Anxiety 03/12/2015  . CHF (congestive heart failure) (HCC) 03/12/2015  . Chronic obstructive asthma (HCC) 03/12/2015  . GERD (gastroesophageal reflux disease) 03/12/2015  . HTN (hypertension) 03/12/2015  . Hypothyroidism 03/12/2015  . Femur fracture (HCC) 01/20/2015    Orientation RESPIRATION BLADDER Height & Weight    Self  Normal Incontinent 5\' 3"  (160 cm) 180 lbs.  BEHAVIORAL SYMPTOMS/MOOD NEUROLOGICAL BOWEL NUTRITION STATUS      Incontinent Diet (Heart Healthy, Thin Liquids)  AMBULATORY STATUS COMMUNICATION OF NEEDS Skin   Extensive Assist Verbally Normal                       Personal Care Assistance Level of Assistance  Bathing, Feeding, Dressing Bathing Assistance: Maximum assistance Feeding assistance: Maximum assistance Dressing Assistance: Maximum assistance     Functional Limitations Info  Sight, Hearing, Speech Sight Info: Adequate Hearing Info: Adequate Speech Info: Adequate    SPECIAL CARE FACTORS FREQUENCY                       Contractures      Additional Factors  Info  Code Status, Allergies Code Status Info: Full Code Allergies Info: Allergies: Codeine, Lexapro, Lyrica, Mysoline           Current Medications (05/19/2015):  This is the current hospital active medication list Current Facility-Administered Medications  Medication Dose Route Frequency Provider Last Rate Last Dose  . 0.9 %  sodium chloride infusion   Intravenous Continuous Enid Baas, MD 60 mL/hr at 05/18/15 2300    . acetaminophen (TYLENOL) tablet 650 mg  650 mg Oral Q6H PRN Arnaldo Natal, MD       Or  . acetaminophen (TYLENOL) suppository 650 mg  650 mg Rectal Q6H PRN Arnaldo Natal, MD      . docusate sodium (COLACE) capsule 100 mg  100 mg Oral BID Arnaldo Natal, MD   100 mg at 05/19/15 0936  . doxepin (SINEQUAN) capsule 10 mg  10 mg Oral Daily Arnaldo Natal, MD   10 mg at 05/19/15 0936  . ferrous sulfate tablet 325 mg  325 mg Oral Q breakfast Arnaldo Natal, MD   325 mg at 05/19/15 0935   And  . vitamin C (ASCORBIC ACID) tablet 250 mg  250 mg Oral Q breakfast Arnaldo Natal, MD   250 mg at 05/19/15 5621  . haloperidol lactate (HALDOL) injection 1 mg  1 mg Intravenous Q6H PRN Enid Baas, MD   1 mg at 05/18/15 1103  . levothyroxine (SYNTHROID, LEVOTHROID) tablet 75 mcg  75 mcg Oral QAC breakfast Arnaldo Natal, MD   75 mcg at 05/19/15 0536  . LORazepam (ATIVAN) injection 1-2 mg  1-2 mg Intravenous Q4H PRN Enid Baas, MD      . nitroGLYCERIN (NITROSTAT) SL tablet 0.4 mg  0.4 mg Sublingual Q5 min PRN Arnaldo Natal, MD      . nystatin cream (MYCOSTATIN) 1 application  1 application Topical TID Darci Current, MD   1 application at 05/19/15 [phone]  . ondansetron (ZOFRAN) tablet 4 mg  4 mg Oral Q6H PRN Arnaldo Natal, MD       Or  . ondansetron [facility]) injection 4 mg  4 mg Intravenous Q6H PRN Arnaldo Natal, MD      . pantoprazole (PROTONIX) EC tablet 40 mg  40 mg Oral BID Arnaldo Natal, MD   40 mg at 05/19/15 9604  .  propranolol (INDERAL) tablet 20 mg  20 mg Oral Daily Arnaldo Natal, MD   20 mg at 05/19/15 0935  . risperiDONE (RISPERDAL M-TABS) disintegrating tablet 0.5 mg  0.5 mg Oral BID Enid Baas, MD   0.5 mg at 05/19/15 0936  . sodium chloride 0.9 % injection 3 mL  3 mL Intravenous Q12H Arnaldo Natal, MD   3 mL at 05/18/15 2150  . warfarin (COUMADIN) tablet 5 mg  5 mg Oral q1800 Arnaldo Natal, MD   5 mg at 05/18/15 1738     Discharge Medications: Please see discharge summary for a list of discharge medications.  Relevant Imaging Results:  Relevant Lab Results:   Additional Information SSN:  [id number]  Dede Query, LCSW

## 2015-05-19 NOTE — Clinical Social Work Placement (Signed)
   CLINICAL SOCIAL WORK PLACEMENT  NOTE  Date:  05/19/2015  Patient Details  Name: Victoria Johnston MRN: [id number] Date of Birth: 10-25-1923  Clinical Social Work is seeking post-discharge placement for this patient at the Skilled  Nursing Facility level of care (*CSW will initial, date and re-position this form in  chart as items are completed):  Yes   Patient/family provided with [facility] Clinical Social Work Department's list of facilities offering this level of care within the geographic area requested by the patient (or if unable, by the patient's family).  Yes   Patient/family informed of their freedom to choose among providers that offer the needed level of care, that participate in Medicare, Medicaid or managed care program needed by the patient, have an available bed and are willing to accept the patient.  Yes   Patient/family informed of [facility]'s ownership interest in [facility] and [facility], as well as of the fact that they are under no obligation to receive care at these facilities.  PASRR submitted to EDS on       PASRR number received on       Existing PASRR number confirmed on 05/18/15     FL2 transmitted to all facilities in geographic area requested by pt/family on 05/18/15     FL2 transmitted to all facilities within larger geographic area on       Patient informed that his/her managed care company has contracts with or will negotiate with certain facilities, including the following:        Yes   Patient/family informed of bed offers received.  Patient chooses bed at [facility]     Physician recommends and patient chooses bed at  [facility])    Patient to be transferred to [facility] on 05/19/15.  Patient to be transferred to facility by [facility]     Patient family notified on 05/19/15 of transfer.  Name of family member notified:  Cala Bradford, daughter     PHYSICIAN       Additional  Comment:    _______________________________________________ Dede Query, LCSW 05/19/2015, 2:45 PM

## 2015-05-19 NOTE — Progress Notes (Signed)
EMS here to transport patient to [facility]-no distress noted

## 2015-05-19 NOTE — Plan of Care (Signed)
Problem: Bowel/Gastric: Goal: Will not experience complications related to bowel motility Outcome: Progressing Confused, sitter at bedside. Continue to monitor.

## 2015-05-19 NOTE — Discharge Summary (Addendum)
[facility]   PATIENT NAME: Victoria Johnston    MR#:  [id number]  DATE OF BIRTH:  [birth year]  DATE OF ADMISSION:  05/17/2015 ADMITTING PHYSICIAN: Arnaldo Natal, MD  DATE OF DISCHARGE: 05/19/2015  PRIMARY CARE PHYSICIAN: Danella Penton., MD    ADMISSION DIAGNOSIS:  Altered mental status, unspecified altered mental status type [R41.82]  DISCHARGE DIAGNOSIS:  Active Problems:   Agitation   SECONDARY DIAGNOSIS:   Past Medical History  Diagnosis Date  . Swelling   . Hearing loss   . Difficulty breathing   . Bruises easily   . Tremors of nervous system   . HLD (hyperlipidemia)   . Crohn disease (HCC)   . GERD (gastroesophageal reflux disease)   . Raynaud disease   . HTN (hypertension)   . OA (osteoarthritis)   . Chronic obstructive asthma (HCC)   . CHF (congestive heart failure) (HCC)   . DVT (deep vein thrombosis) in pregnancy   . Anxiety     HOSPITAL COURSE:    82 year old female with past medical history significant for DVT on Coumadin from June 2015, Raynaud's disease, GERD, hypertension, CHF and asthma was brought into the hospital secondary to mental status changes.  1. Altered mental status: Patient presented with significant agitation which has subsequently improved since admission. This is due to acute delirium on top of what is  dementia. Patient responded well to Risperdal and will continue Risperdal. Patient was seen by her primary care physician a couple weeks prior to admission and was started on Haldol and when necessary Ativan. She will continue with when necessary Ativan in place of Haldol use Risperdal as this seemed to help with her agitation. There was no evidence of any acute infection at this time.  2. Essential tremor: Patient is on propanolol. Topamax was discontinued  3. History of DVT: Due to recent falls and history of DVT in 2015 Coumadin was discontinued. This was discussed with patient's  daughter by Dr. Adria Devon.  4. Hypokalemia: Potassium was repleted prior to discharge.    DISCHARGE CONDITIONS AND DIET:   Patient stable for discharge on regular diet  CONSULTS OBTAINED:  Treatment Team:  Thana Farr, MD  DRUG ALLERGIES:   Allergies  Allergen Reactions  . Codeine Nausea Only  . Lexapro [Escitalopram Oxalate] Other (See Comments)    Reaction: unknown  . Lyrica [Pregabalin] Other (See Comments)    Reaction: unknown  . Mysoline [Primidone] Other (See Comments)    Reaction: unknown    DISCHARGE MEDICATIONS:   Current Discharge Medication List    START taking these medications   Details  risperiDONE (RISPERDAL M-TABS) 0.5 MG disintegrating tablet Take 1 tablet (0.5 mg total) by mouth 2 (two) times daily. Qty: 60 tablet, Refills: 0      CONTINUE these medications which have NOT CHANGED   Details  doxepin (SINEQUAN) 10 MG capsule Take 1 capsule by mouth daily.    Iron-Vitamin C (VITRON-C) 65-125 MG TABS Take 1 tablet by mouth daily.    levothyroxine (SYNTHROID, LEVOTHROID) 75 MCG tablet Take 75 mcg by mouth daily before breakfast.    LORazepam (ATIVAN) 1 MG tablet Take 1 tablet (1 mg total) by mouth every 8 (eight) hours as needed for anxiety. Qty: 30 tablet, Refills: 0    nitroGLYCERIN (NITROSTAT) 0.4 MG SL tablet Place 0.4 mg under the tongue every 5 (five) minutes as needed for chest pain.    pantoprazole (PROTONIX) 40 MG tablet Take 40 mg  by mouth 2 (two) times daily.    propranolol (INDERAL) 40 MG tablet Take 20 mg by mouth daily.     acetaminophen (TYLENOL) 325 MG tablet Take 2 tablets (650 mg total) by mouth every 6 (six) hours as needed for mild pain (or Fever >/= 101). Qty: 1 tablet, Refills: 1    bisacodyl (DULCOLAX) 10 MG suppository Place 1 suppository (10 mg total) rectally daily as needed for moderate constipation. Qty: 12 suppository, Refills: 0    docusate sodium (COLACE) 100 MG capsule Take 1 capsule (100 mg total) by mouth  2 (two) times daily. Qty: 10 capsule, Refills: 0      STOP taking these medications     haloperidol (HALDOL) 1 MG tablet      topiramate (TOPAMAX) 100 MG tablet      warfarin (COUMADIN) 5 MG tablet      cephALEXin (KEFLEX) 250 MG capsule      minocycline (MINOCIN,DYNACIN) 100 MG capsule      oxyCODONE (OXY IR/ROXICODONE) 5 MG immediate release tablet      senna (SENOKOT) 8.6 MG TABS tablet               Today   CHIEF COMPLAINT:    She is eating her breakfast this morning and seems very calm and not agitated.   VITAL SIGNS:  Blood pressure 135/53, pulse 70, temperature 97.7 F (36.5 C), temperature source Oral, resp. rate 16, height 5\' 3"  (1.6 m), weight 83.008 kg (183 lb), SpO2 99 %.   REVIEW OF SYSTEMS:  Review of Systems  Unable to perform ROS: intubated     PHYSICAL EXAMINATION:  GENERAL:  82 y.o.-year-old patient lying in the bed with no acute distress.  NECK:  Supple, no jugular venous distention. No thyroid enlargement, no tenderness.  LUNGS: Normal breath sounds bilaterally, no wheezing, rales,rhonchi  No use of accessory muscles of respiration.  CARDIOVASCULAR: S1, S2 normal. No murmurs, rubs, or gallops.  ABDOMEN: Soft, non-tender, non-distended. Bowel sounds present. No organomegaly or mass.  EXTREMITIES: No pedal edema, cyanosis, or clubbing.  PSYCHIATRIC: The patient is alert and oriented to name only  SKIN: No obvious rash, lesion, or ulcer.   DATA REVIEW:   CBC  Recent Labs Lab 05/19/15 0609  WBC 4.5  HGB 10.2*  HCT 31.7*  PLT 163    Chemistries   Recent Labs Lab 05/17/15 0050 05/19/15 0609  NA 140 138  K 3.4* 3.0*  CL 112* 111  CO2 23 22  GLUCOSE 104* 93  BUN 14 7  CREATININE 1.11* 0.82  CALCIUM 8.5* 7.7*  AST 14*  --   ALT 8*  --   ALKPHOS 83  --   BILITOT 0.8  --     Cardiac Enzymes No results for input(s): TROPONINI in the last 168 hours.  Microbiology Results  @[email or web address] @  RADIOLOGY:  US Venous Img  Lower Bilateral  05/17/2015  CLINICAL DATA:  Bilateral lower extremity swelling. EXAM: BILATERAL LOWER EXTREMITY VENOUS DOPPLER ULTRASOUND TECHNIQUE: Gray-scale sonography with graded compression, as well as color Doppler and duplex ultrasound were performed to evaluate the lower extremity deep venous systems from the level of the common femoral vein and including the common femoral, femoral, profunda femoral, popliteal and calf veins including the posterior tibial, peroneal and gastrocnemius veins when visible. The superficial great saphenous vein was also interrogated. Spectral Doppler was utilized to evaluate flow at rest and with distal augmentation maneuvers in the common femoral, femoral and popliteal veins. COMPARISON:  None. FINDINGS: Limited exam because of body habitus and peripheral edema. RIGHT LOWER EXTREMITY Common Femoral Vein: No evidence of thrombus. Normal compressibility, respiratory phasicity and response to augmentation. Saphenofemoral Junction: No evidence of thrombus. Normal compressibility and flow on color Doppler imaging. Profunda Femoral Vein: No evidence of thrombus. Normal compressibility and flow on color Doppler imaging. Femoral Vein: No evidence of thrombus. Normal compressibility, respiratory phasicity and response to augmentation. Popliteal Vein: No evidence of thrombus. Normal compressibility, respiratory phasicity and response to augmentation. Calf Veins: No evidence of thrombus. Normal compressibility and flow on color Doppler imaging. Superficial Great Saphenous Vein: No evidence of thrombus. Normal compressibility and flow on color Doppler imaging. Venous Reflux:  None. Other Findings:  None. LEFT LOWER EXTREMITY Common Femoral Vein: No evidence of thrombus. Normal compressibility, respiratory phasicity and response to augmentation. Saphenofemoral Junction: No evidence of thrombus. Normal compressibility and flow on color Doppler imaging. Profunda Femoral Vein: No evidence of  thrombus. Normal compressibility and flow on color Doppler imaging. Femoral Vein: No evidence of thrombus. Normal compressibility, respiratory phasicity and response to augmentation. Popliteal Vein: No evidence of thrombus. Normal compressibility, respiratory phasicity and response to augmentation. Calf Veins: No evidence of thrombus. Normal compressibility and flow on color Doppler imaging. Superficial Great Saphenous Vein: No evidence of thrombus. Normal compressibility and flow on color Doppler imaging. Venous Reflux:  None. Other Findings:  None. IMPRESSION: No evidence of significant occlusive deep venous thrombosis in either extremity. Electronically Signed   By: Judie Petit.  Shick M.D.   On: 05/17/2015 16:22      Stable for discharge   Patient should follow up with PCP 1 week  CODE STATUS:     Code Status Orders        Start     Ordered   05/17/15 0732  Full code   Continuous     05/17/15 0731    Code Status History    Date Active Date Inactive Code Status Order ID Comments User Context   03/12/2015 11:50 PM 03/14/2015  7:08 PM Full Code [id number]  Oralia Manis, MD Inpatient   01/22/2015  3:04 PM 01/25/2015  2:59 PM Full Code [id number]  Kennedy Bucker, MD Inpatient   01/20/2015  2:26 PM 01/22/2015  3:04 PM Full Code [id number]  Enedina Finner, MD Inpatient      TOTAL TIME TAKING CARE OF THIS PATIENT: 35 minutes.    Note: This dictation was prepared with Dragon dictation along with smaller phrase technology. Any transcriptional errors that result from this process are unintentional.  Zilla Shartzer M.D on 05/19/2015 at 10:50 AM  Between 7am to 6pm - Pager - [phone] After 6pm go to [email or web address] - password [email or web address] [facility]  [place]  [facility]  Office  [phone]  CC: Primary care physician; Danella Penton., MD

## 2015-05-19 NOTE — Progress Notes (Signed)
ANTICOAGULATION CONSULT NOTE - Follow Up Consult  Pharmacy Consult for Coumadin Indication: DVT  Allergies  Allergen Reactions  . Codeine Nausea Only  . Lexapro [Escitalopram Oxalate] Other (See Comments)    Reaction: unknown  . Lyrica [Pregabalin] Other (See Comments)    Reaction: unknown  . Mysoline [Primidone] Other (See Comments)    Reaction: unknown    Patient Measurements: Height:  (160 cm) Weight: 183 lb (83.008 kg) IBW/kg (Calculated) : 52.4 Heparin Dosing Weight: na  Vital Signs: Temp: 97.7 F (36.5 C) (01/20 0913) Temp Source: Oral (01/20 0913) BP: 135/53 mmHg (01/20 0913) Pulse Rate: 70 (01/20 0913)  Labs:  Recent Labs  05/17/15 0050 05/17/15 0810 05/18/15 0435 05/19/15 0609  HGB 11.5*  --   --  10.2*  HCT 35.3  --   --  31.7*  PLT 179  --   --  163  LABPROT  --  26.2* 26.4* 27.4*  INR  --  2.44 2.46 2.59  CREATININE 1.11*  --   --  0.82    Estimated Creatinine Clearance: 47.4 mL/min (by C-G formula based on Cr of 0.82).   Medical History: Past Medical History  Diagnosis Date  . Swelling   . Hearing loss   . Difficulty breathing   . Bruises easily   . Tremors of nervous system   . HLD (hyperlipidemia)   . Crohn disease (HCC)   . GERD (gastroesophageal reflux disease)   . Raynaud disease   . HTN (hypertension)   . OA (osteoarthritis)   . Chronic obstructive asthma (HCC)   . CHF (congestive heart failure) (HCC)   . DVT (deep vein thrombosis) in pregnancy   . Anxiety     Medications:  Prescriptions prior to admission  Medication Sig Dispense Refill Last Dose  . doxepin (SINEQUAN) 10 MG capsule Take 1 capsule by mouth daily.   05/16/2015 at Unknown time  . haloperidol (HALDOL) 1 MG tablet Take 1 tablet (1 mg total) by mouth at bedtime. 1 tablet 1 05/16/2015 at Unknown time  . Iron-Vitamin C (VITRON-C) 65-125 MG TABS Take 1 tablet by mouth daily.   05/16/2015 at Unknown time  . levothyroxine (SYNTHROID, LEVOTHROID) 75 MCG tablet Take  75 mcg by mouth daily before breakfast.   05/16/2015 at Unknown time  . LORazepam (ATIVAN) 1 MG tablet Take 1 tablet (1 mg total) by mouth every 8 (eight) hours as needed for anxiety. 30 tablet 0 prn at prn  . nitroGLYCERIN (NITROSTAT) 0.4 MG SL tablet Place 0.4 mg under the tongue every 5 (five) minutes as needed for chest pain.   prn at prn  . pantoprazole (PROTONIX) 40 MG tablet Take 40 mg by mouth 2 (two) times daily.   05/16/2015 at Unknown time  . propranolol (INDERAL) 40 MG tablet Take 20 mg by mouth daily.    05/16/2015 at Unknown time  . topiramate (TOPAMAX) 100 MG tablet Take 50 mg by mouth at bedtime.    05/16/2015 at Unknown time  . warfarin (COUMADIN) 5 MG tablet Take 2.5-5 mg by mouth daily at 6 PM. 2.5mg  one day a week and  all other days   05/16/2015 at Unknown time  . acetaminophen (TYLENOL) 325 MG tablet Take 2 tablets (650 mg total) by mouth every 6 (six) hours as needed for mild pain (or Fever >/= 101). (Patient not taking: Reported on 03/12/2015) 1 tablet 1 Not Taking at Unknown time  . bisacodyl (DULCOLAX) 10 MG suppository Place 1 suppository (10 mg  total) rectally daily as needed for moderate constipation. (Patient not taking: Reported on 03/12/2015) 12 suppository 0 Not Taking at Unknown time  . cephALEXin (KEFLEX) 250 MG capsule Take 1 capsule (250 mg total) by mouth every 8 (eight) hours. (Patient not taking: Reported on 05/17/2015) 15 capsule 0 Completed Course at Unknown time  . docusate sodium (COLACE) 100 MG capsule Take 1 capsule (100 mg total) by mouth 2 (two) times daily. (Patient not taking: Reported on 05/17/2015) 10 capsule 0 Not Taking at Unknown time  . minocycline (MINOCIN,DYNACIN) 100 MG capsule Take 1 capsule (100 mg total) by mouth 2 (two) times daily. (Patient not taking: Reported on 03/12/2015) 14 capsule 0 Completed Course at Unknown time  . oxyCODONE (OXY IR/ROXICODONE) 5 MG immediate release tablet Take 1-2 tablets (5-10 mg total) by mouth every 4 (four) hours  as needed for breakthrough pain ((for MODERATE breakthrough pain)). (Patient not taking: Reported on 05/17/2015) 30 tablet 0 Not Taking at Unknown time  . senna (SENOKOT) 8.6 MG TABS tablet Take 1 tablet (8.6 mg total) by mouth 2 (two) times daily. (Patient not taking: Reported on 05/17/2015) 120 each 0 Not Taking at Unknown time   Scheduled:  . docusate sodium  100 mg Oral BID  . doxepin  10 mg Oral Daily  . ferrous sulfate  325 mg Oral Q breakfast   And  . vitamin C  250 mg Oral Q breakfast  . levothyroxine  75 mcg Oral QAC breakfast  . nystatin cream  1 application Topical TID  . pantoprazole  40 mg Oral BID  . propranolol  20 mg Oral Daily  . risperiDONE  0.5 mg Oral BID  . sodium chloride  3 mL Intravenous Q12H  . warfarin  5 mg Oral q1800    Assessment: Patient is a 82yo female admitted for altered mental status. Was taking Coumadin 5mg  daily prior to admission for a-fib. Pharmacy consulted to manage.  1/18 INR=2.44, warfarin 5 mg 1/19 INR=2.46, warfarin 5mg  1/20 INR=2.59,  Goal of Therapy:  INR 2-3 Monitor platelets by anticoagulation protocol: Yes   Plan:  INR within goal range. Will continue with Coumadin 5mg  daily. Will check daily INR while here.  Pharmacy will continue to follow.   Clovia Cuff, PharmD, BCPS 05/19/2015 9:40 AM

## 2015-05-19 NOTE — Progress Notes (Signed)
Report called to RN at [facility]

## 2015-05-19 NOTE — Clinical Social Work Note (Signed)
Pt is ready for discharge today to [facility]. Pt's daughter will bring a check for $2200 for private pay at the facility. CSW updated facility and facility has received discharge information. Facility is ready to admit pt. Pt's daughter is aware and agreeable to discharge plan. RN will call report and EMS will provide transportation. CSW is signing off as no further needs identified.   Dede Query, MSW, LCSW Clinical Social Worker  [phone]

## 2015-05-22 LAB — CULTURE, BLOOD (ROUTINE X 2)
CULTURE: NO GROWTH
Culture: NO GROWTH

## 2017-03-11 IMAGING — US US EXTREM LOW VENOUS BILAT
1 series · 13 of 24 positions shown · non-contrast
Comparison: None.

CLINICAL DATA: Bilateral lower extremity swelling.



[Series 1: us extrem low venous bilat · 0.07mm/px · 13 of 61 slices shown]
[im 1/61]
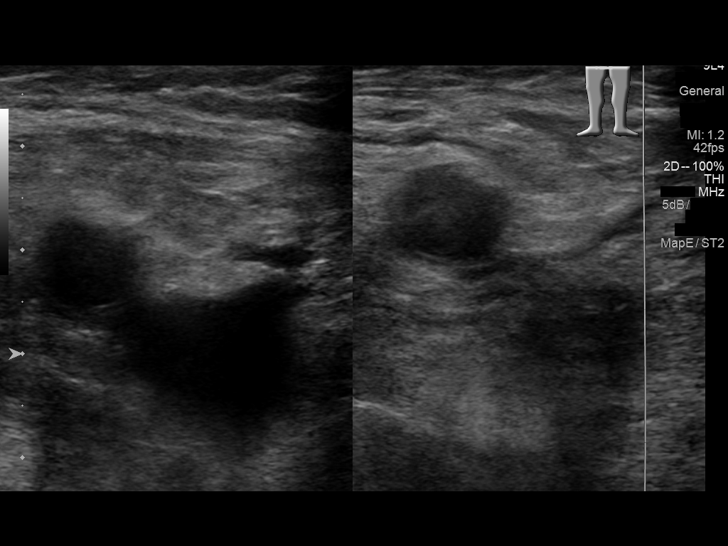
[im 6/61]
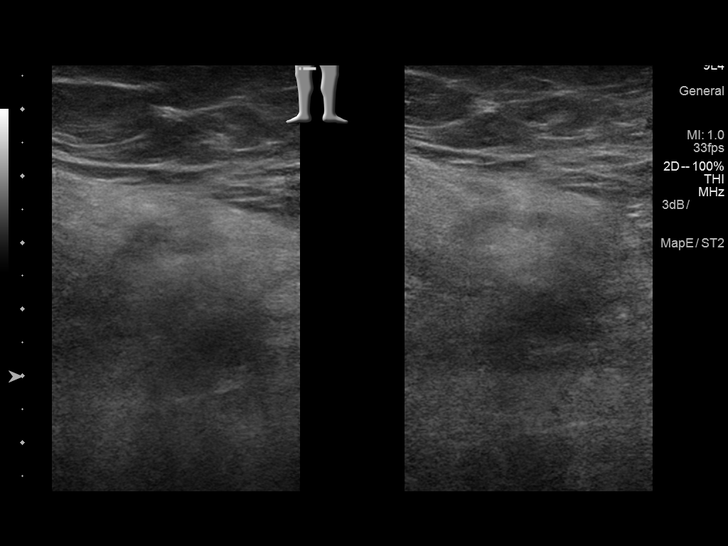
[im 11/61]
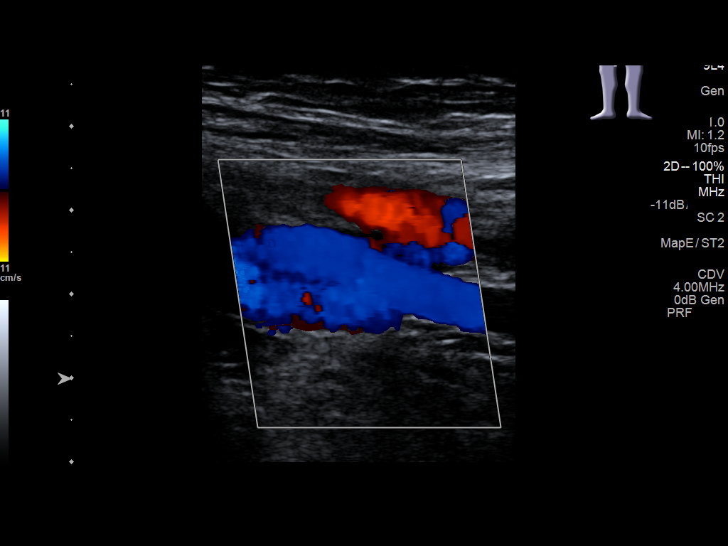
[im 16/61]
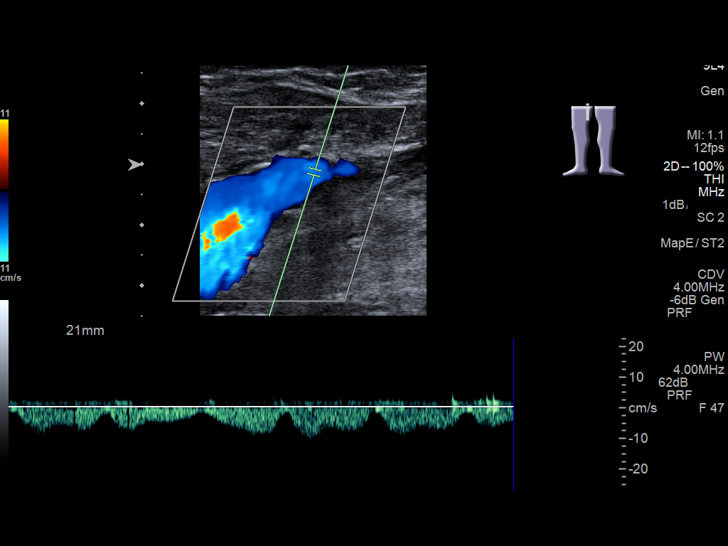
[im 21/61]
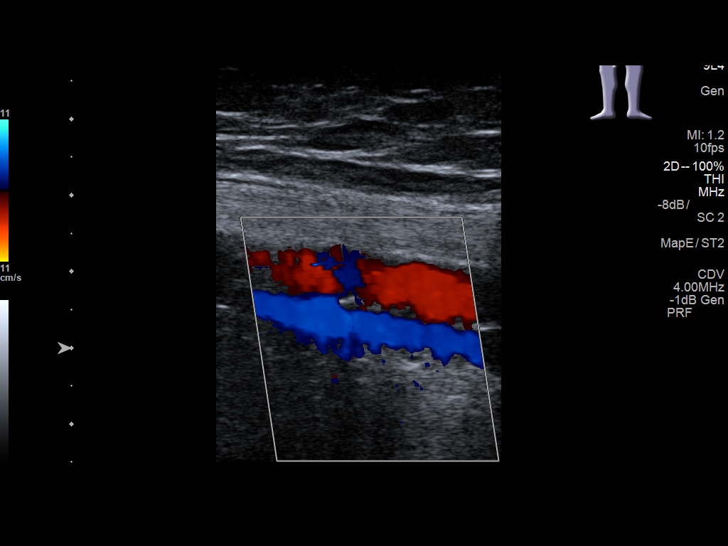
[im 27/61]
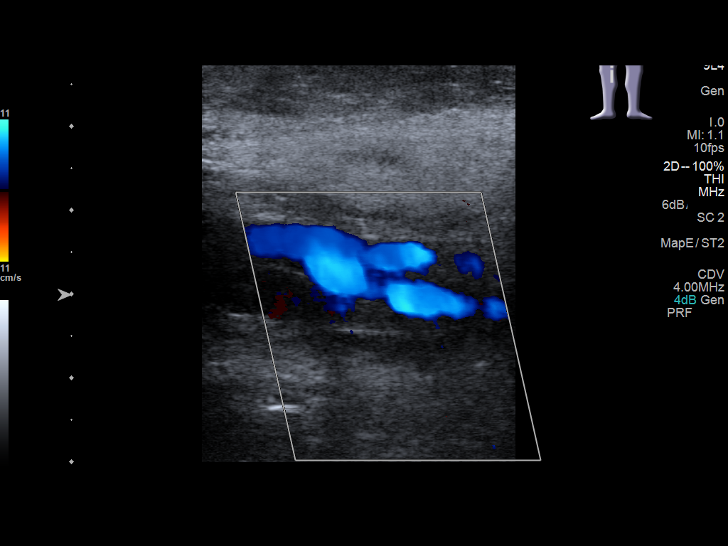
[im 32/61]
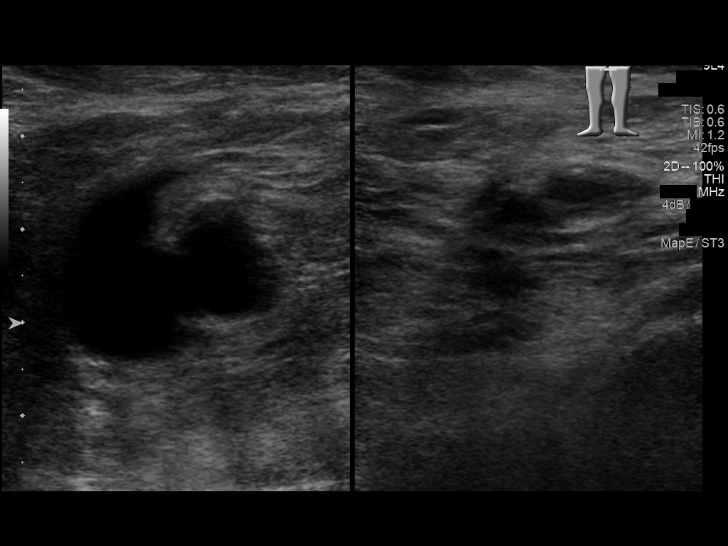
[im 34/61]
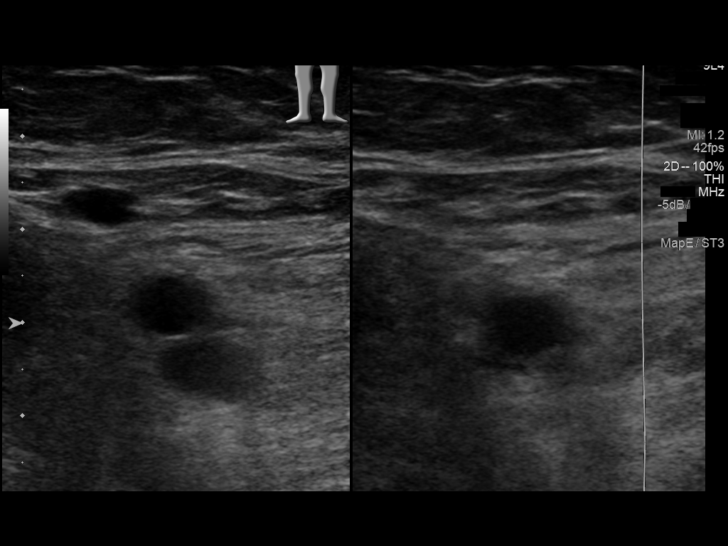
[im 40/61]
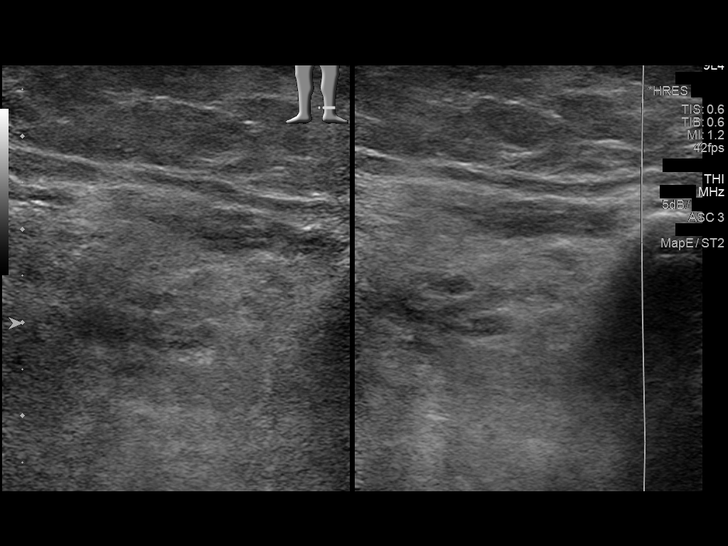
[im 45/61]
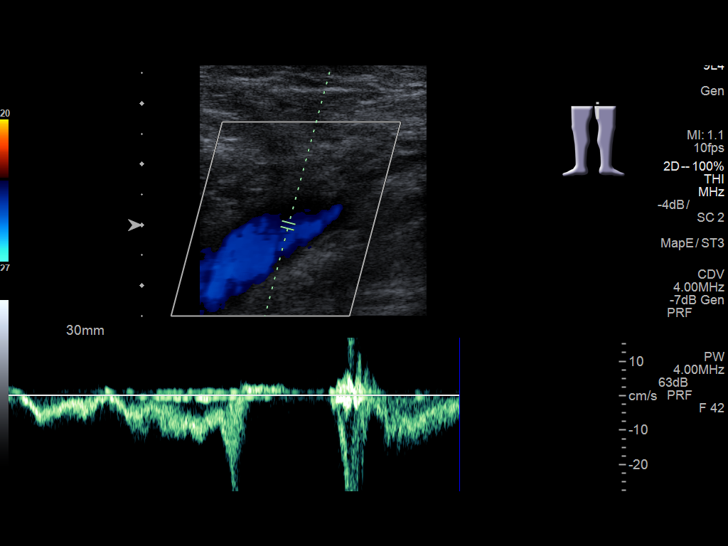
[im 50/61]
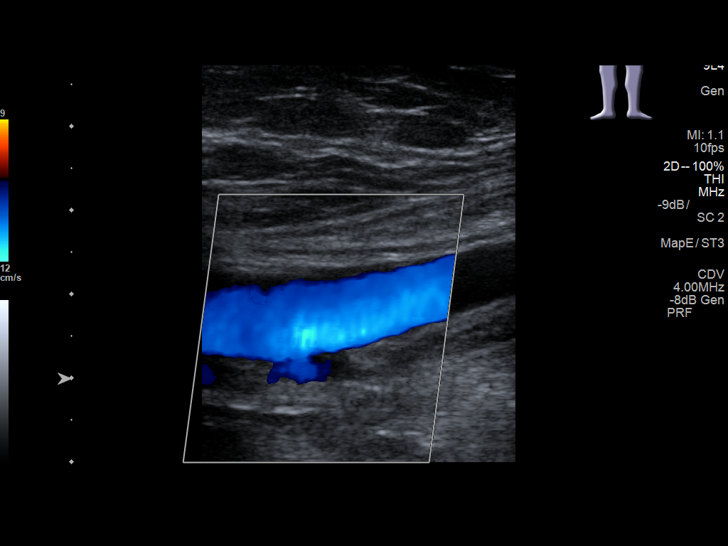
[im 55/61]
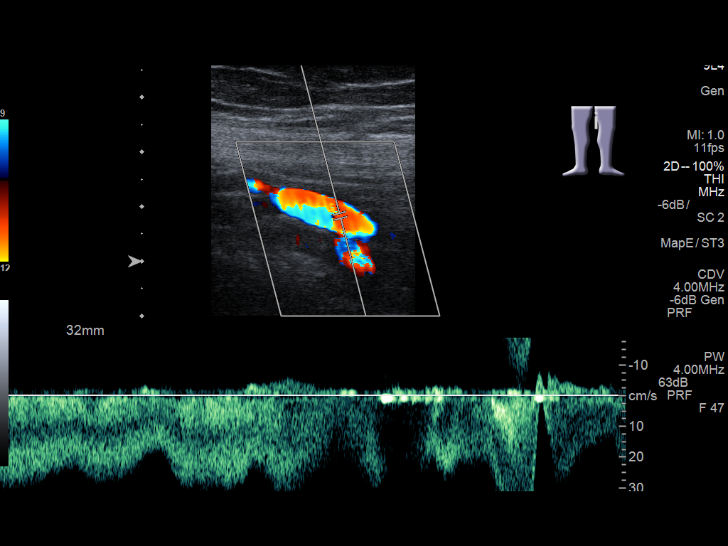
[im 61/61]
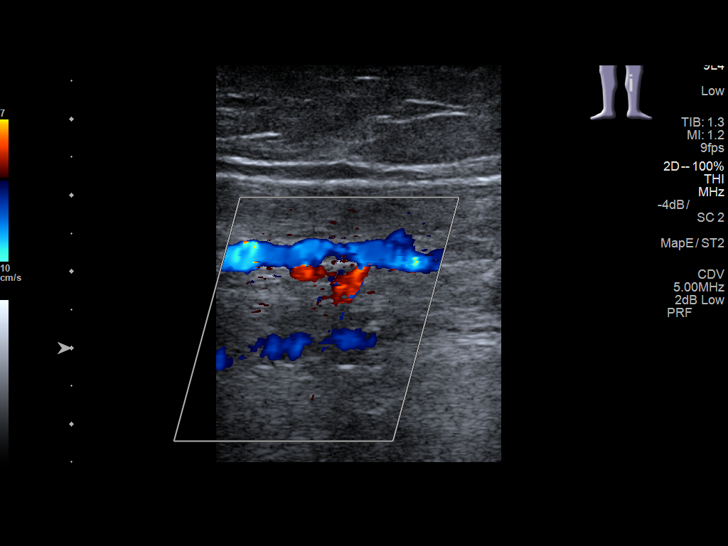

[13 of 24 positions shown; findings below may reference images not displayed]

FINDINGS: Limited exam because of body habitus and peripheral edema.

RIGHT LOWER EXTREMITY

Common Femoral Vein: No evidence of thrombus. Normal
compressibility, respiratory phasicity and response to augmentation.

Saphenofemoral Junction: No evidence of thrombus. Normal
compressibility and flow on color Doppler imaging.

Profunda Femoral Vein: No evidence of thrombus. Normal
compressibility and flow on color Doppler imaging.

Femoral Vein: No evidence of thrombus. Normal compressibility,
respiratory phasicity and response to augmentation.

Popliteal Vein: No evidence of thrombus. Normal compressibility,
respiratory phasicity and response to augmentation.

Calf Veins: No evidence of thrombus. Normal compressibility and flow
on color Doppler imaging.

Superficial Great Saphenous Vein: No evidence of thrombus. Normal
compressibility and flow on color Doppler imaging.

Venous Reflux:  None.

Other Findings:  None.

LEFT LOWER EXTREMITY

Common Femoral Vein: No evidence of thrombus. Normal
compressibility, respiratory phasicity and response to augmentation.

Saphenofemoral Junction: No evidence of thrombus. Normal
compressibility and flow on color Doppler imaging.

Profunda Femoral Vein: No evidence of thrombus. Normal
compressibility and flow on color Doppler imaging.

Femoral Vein: No evidence of thrombus. Normal compressibility,
respiratory phasicity and response to augmentation.

Popliteal Vein: No evidence of thrombus. Normal compressibility,
respiratory phasicity and response to augmentation.

Calf Veins: No evidence of thrombus. Normal compressibility and flow
on color Doppler imaging.

Superficial Great Saphenous Vein: No evidence of thrombus. Normal
compressibility and flow on color Doppler imaging.

Venous Reflux:  None.

Other Findings:  None.
IMPRESSION: No evidence of significant occlusive deep venous thrombosis in
either extremity.

## 2017-08-31 IMAGING — CT CT HEAD W/O CM
2 series · 15 of 30 positions shown, 19 images · non-contrast
Comparison: CT of the head performed 03/12/2015

CLINICAL DATA: Acute onset of altered mental status. Multiple falls
in the past several months. Initial encounter.

EXAM:
CT HEAD WITHOUT CONTRAST
TECHNIQUE: Contiguous axial images were obtained from the base of the skull
through the vertex without intravenous contrast.

[Series 2: soft tissue · axial · 0.46mm/px · z∈[-179,-159]mm · 2 of 32 slices shown]
[im 3/32  brain]
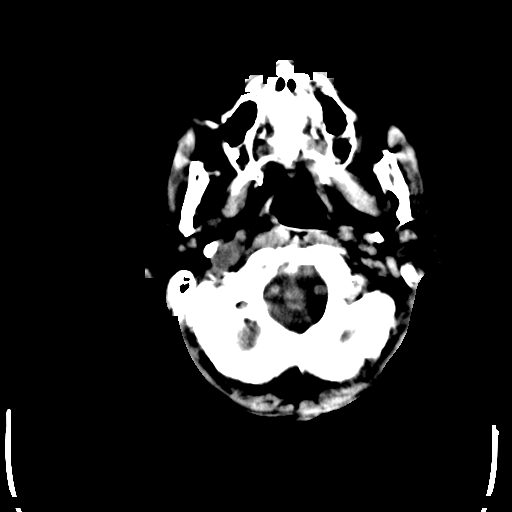
[im 7/32  brain]
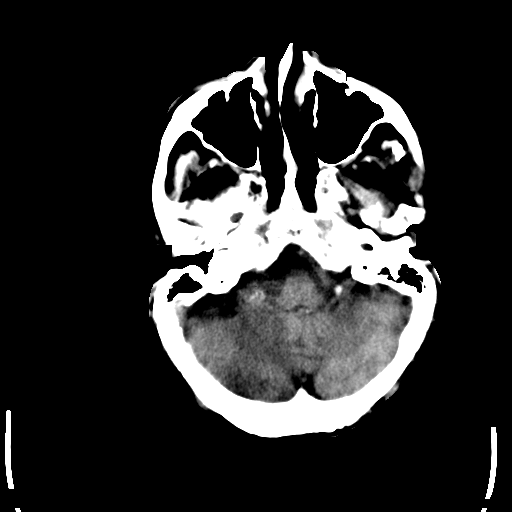

[Series 4: soft tissue recon · axial · 0.42mm/px · z∈[-156,-33]mm · 13 of 31 slices shown, 17 images]
[im 3/31  brain]
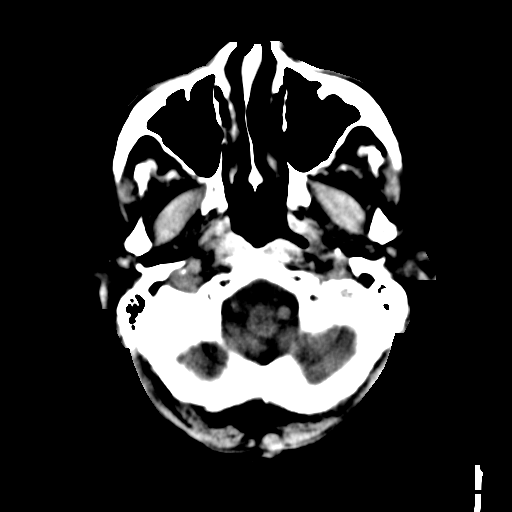
[im 3/31  bone]
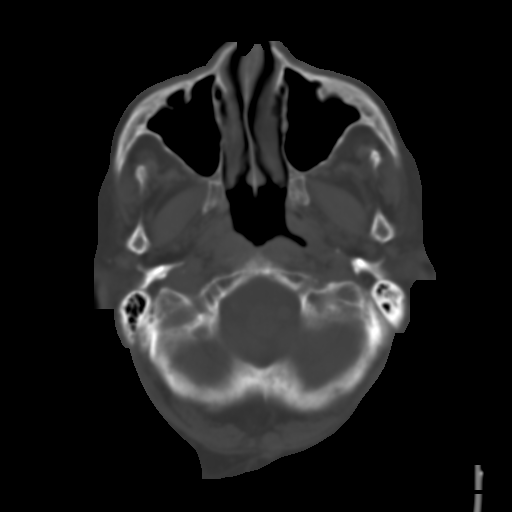
[im 5/31  brain]
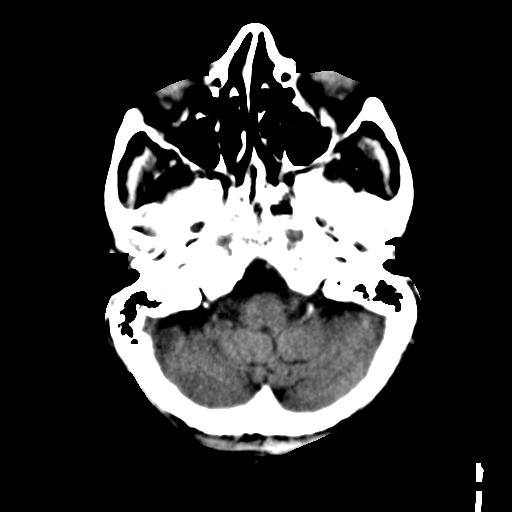
[im 7/31  brain]
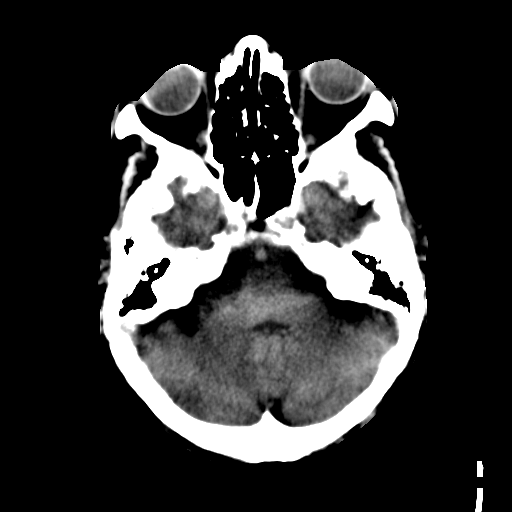
[im 9/31  brain]
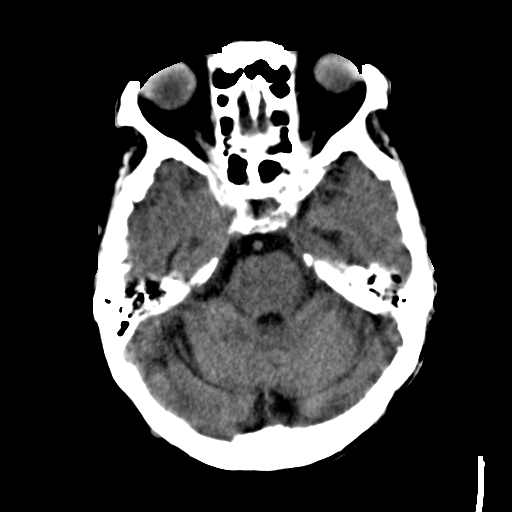
[im 11/31  brain]
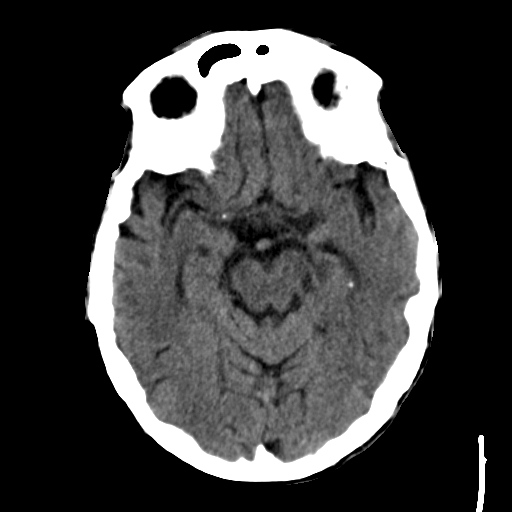
[im 11/31  bone]
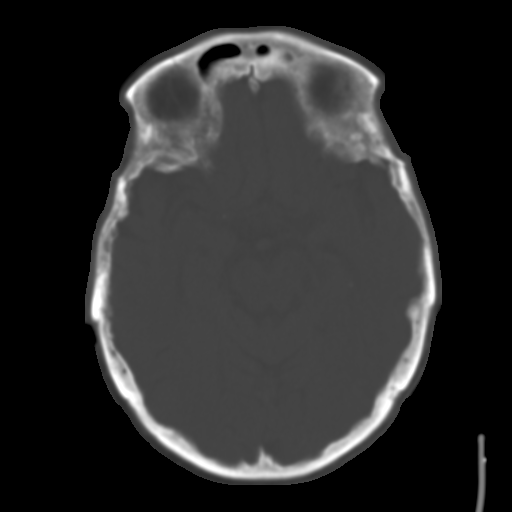
[im 13/31  brain]
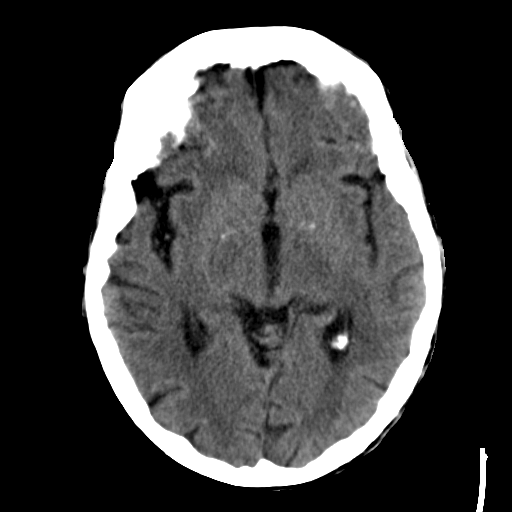
[im 16/31  brain]
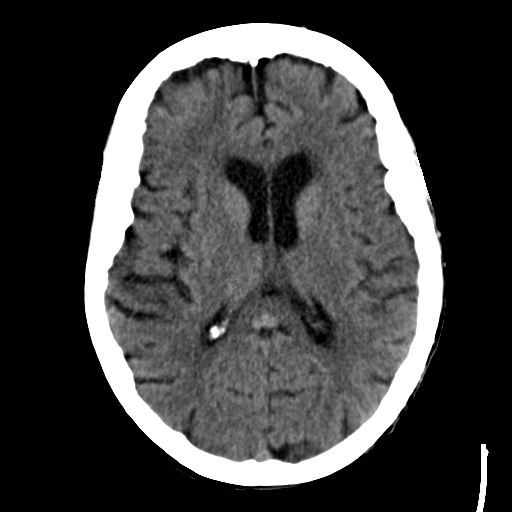
[im 18/31  brain]
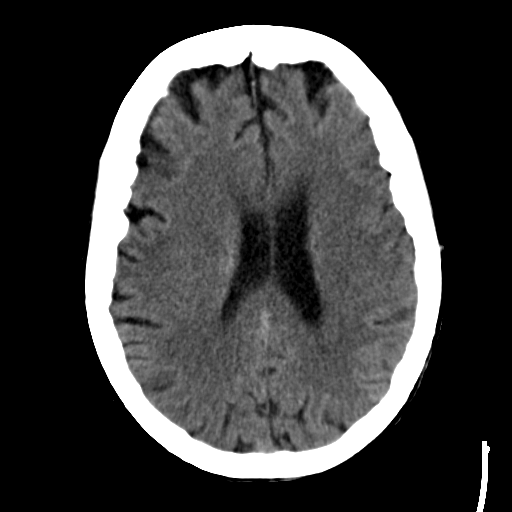
[im 20/31  brain]
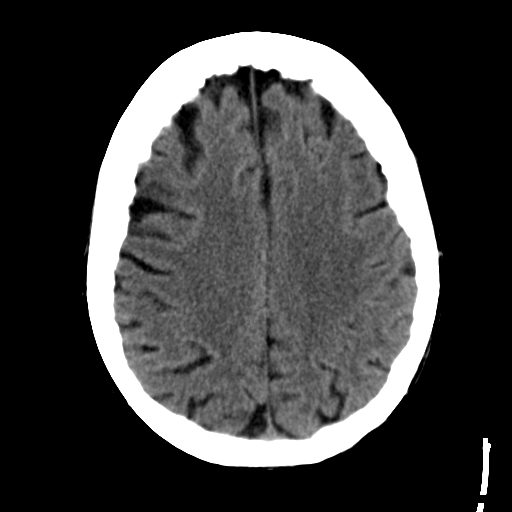
[im 20/31  bone]
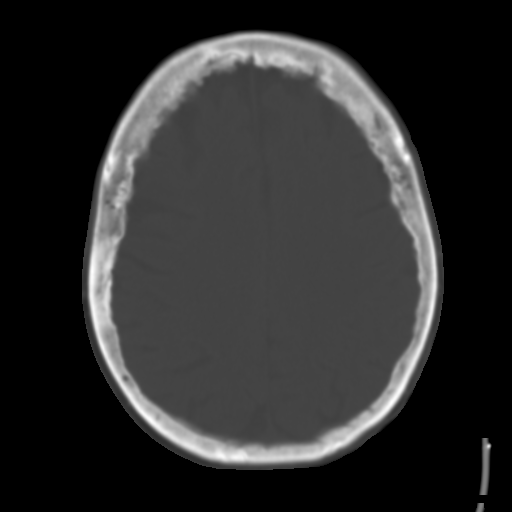
[im 22/31  brain]
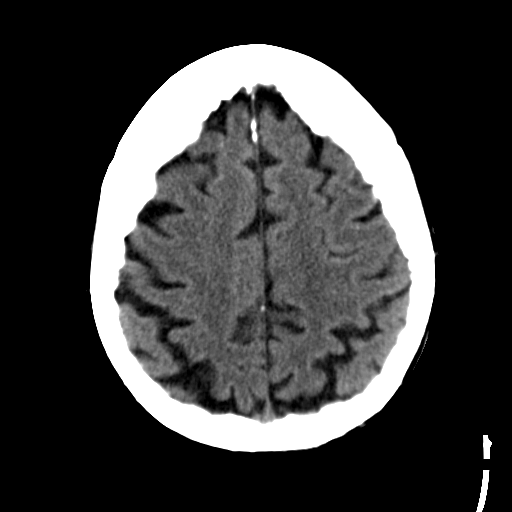
[im 24/31  brain]
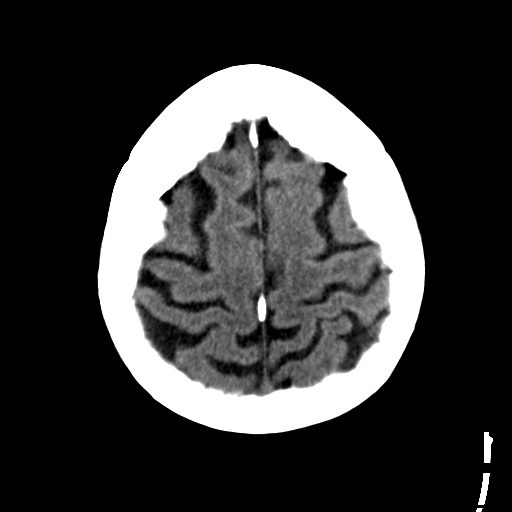
[im 26/31  brain]
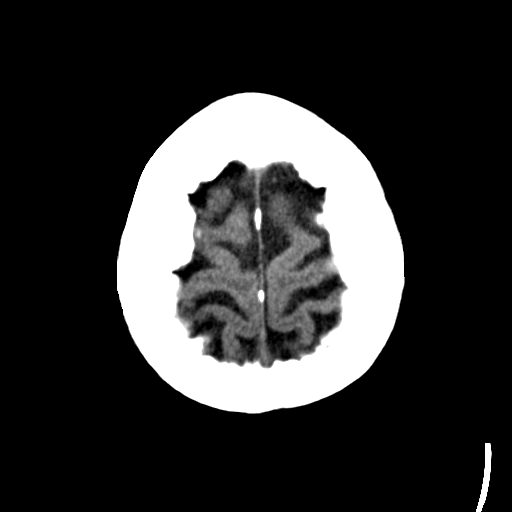
[im 28/31  brain]
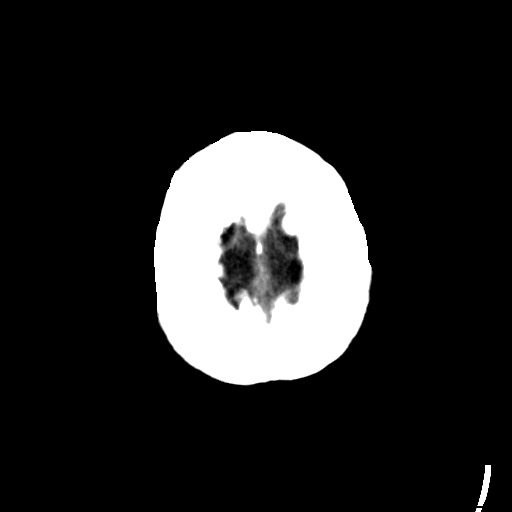
[im 28/31  bone]
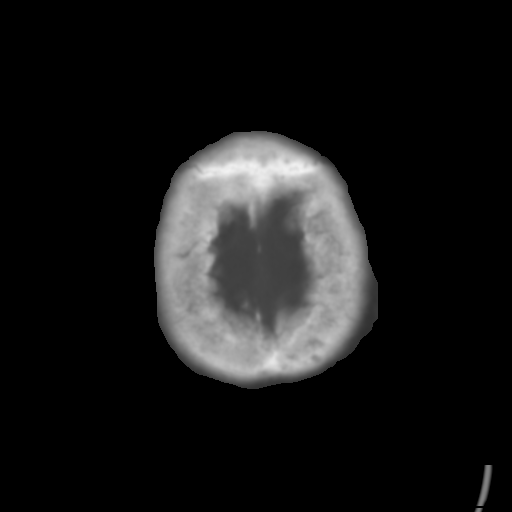

[15 of 30 positions shown; findings below may reference images not displayed]

FINDINGS: There is no evidence of acute infarction, mass lesion, or intra- or
extra-axial hemorrhage on CT.

Prominence of the ventricles and sulci reflects mild cortical volume
loss. Mild cerebellar atrophy is noted. Mild periventricular white
matter change likely reflects small vessel ischemic microangiopathy.

The brainstem and fourth ventricle are within normal limits. The
basal ganglia are unremarkable in appearance. The cerebral
hemispheres demonstrate grossly normal gray-white differentiation.
No mass effect or midline shift is seen.

There is no evidence of fracture; visualized osseous structures are
unremarkable in appearance. The orbits are within normal limits. The
paranasal sinuses and mastoid air cells are well-aerated. No
significant soft tissue abnormalities are seen.
IMPRESSION: 1. No acute intracranial pathology seen on CT.
2. Mild cortical volume loss and scattered small vessel ischemic
microangiopathy.

## 2017-08-31 IMAGING — CR DG CHEST 2V
2 series · 2 of 2 positions shown · non-contrast
Comparison: Chest radiograph performed 03/12/2015

CLINICAL DATA: Acute onset of dyspnea and altered mental status.
Significant upper chest and right arm bruising. Multiple falls in
the past several months. Initial encounter.

EXAM:
CHEST  2 VIEW

[chest lat]
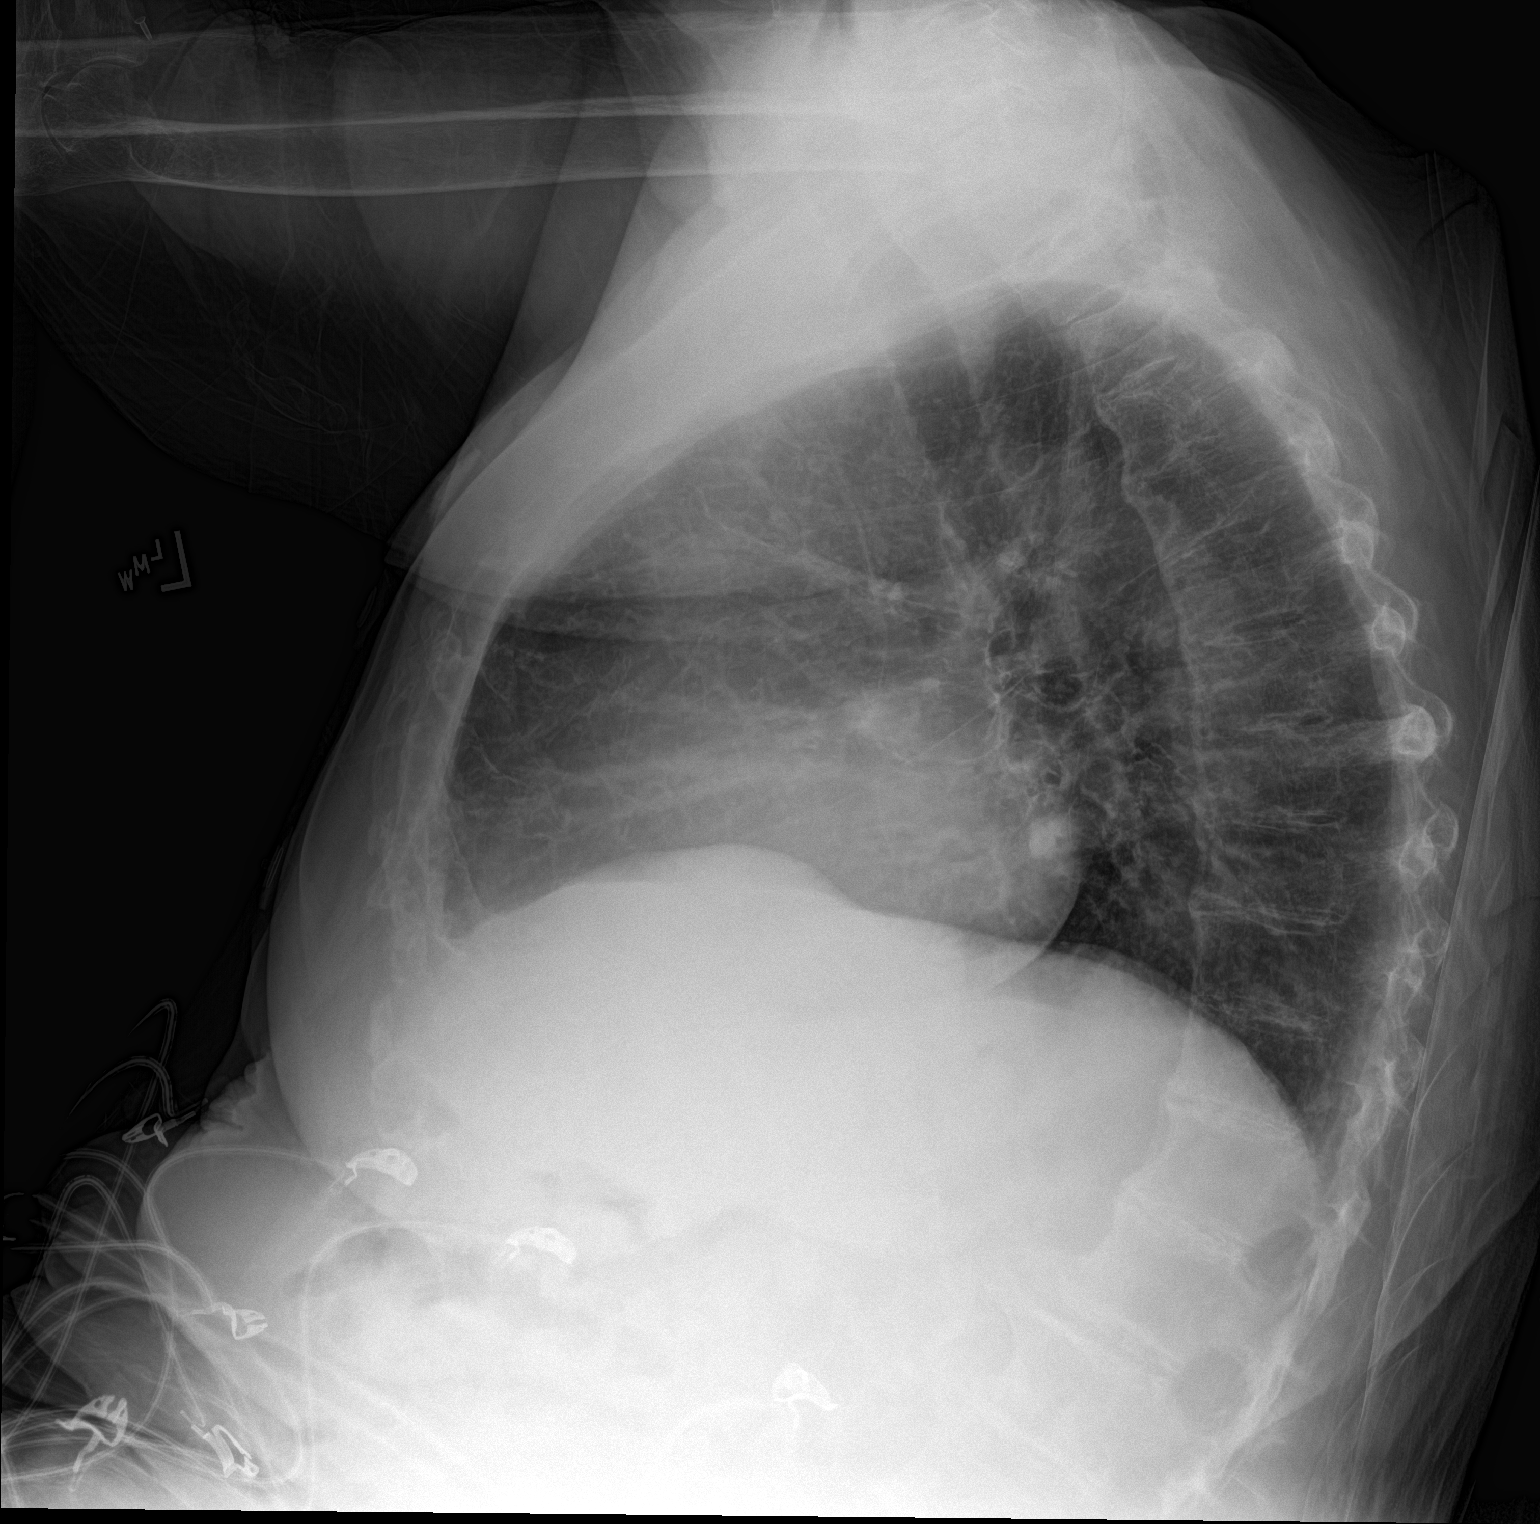

[chest ap]
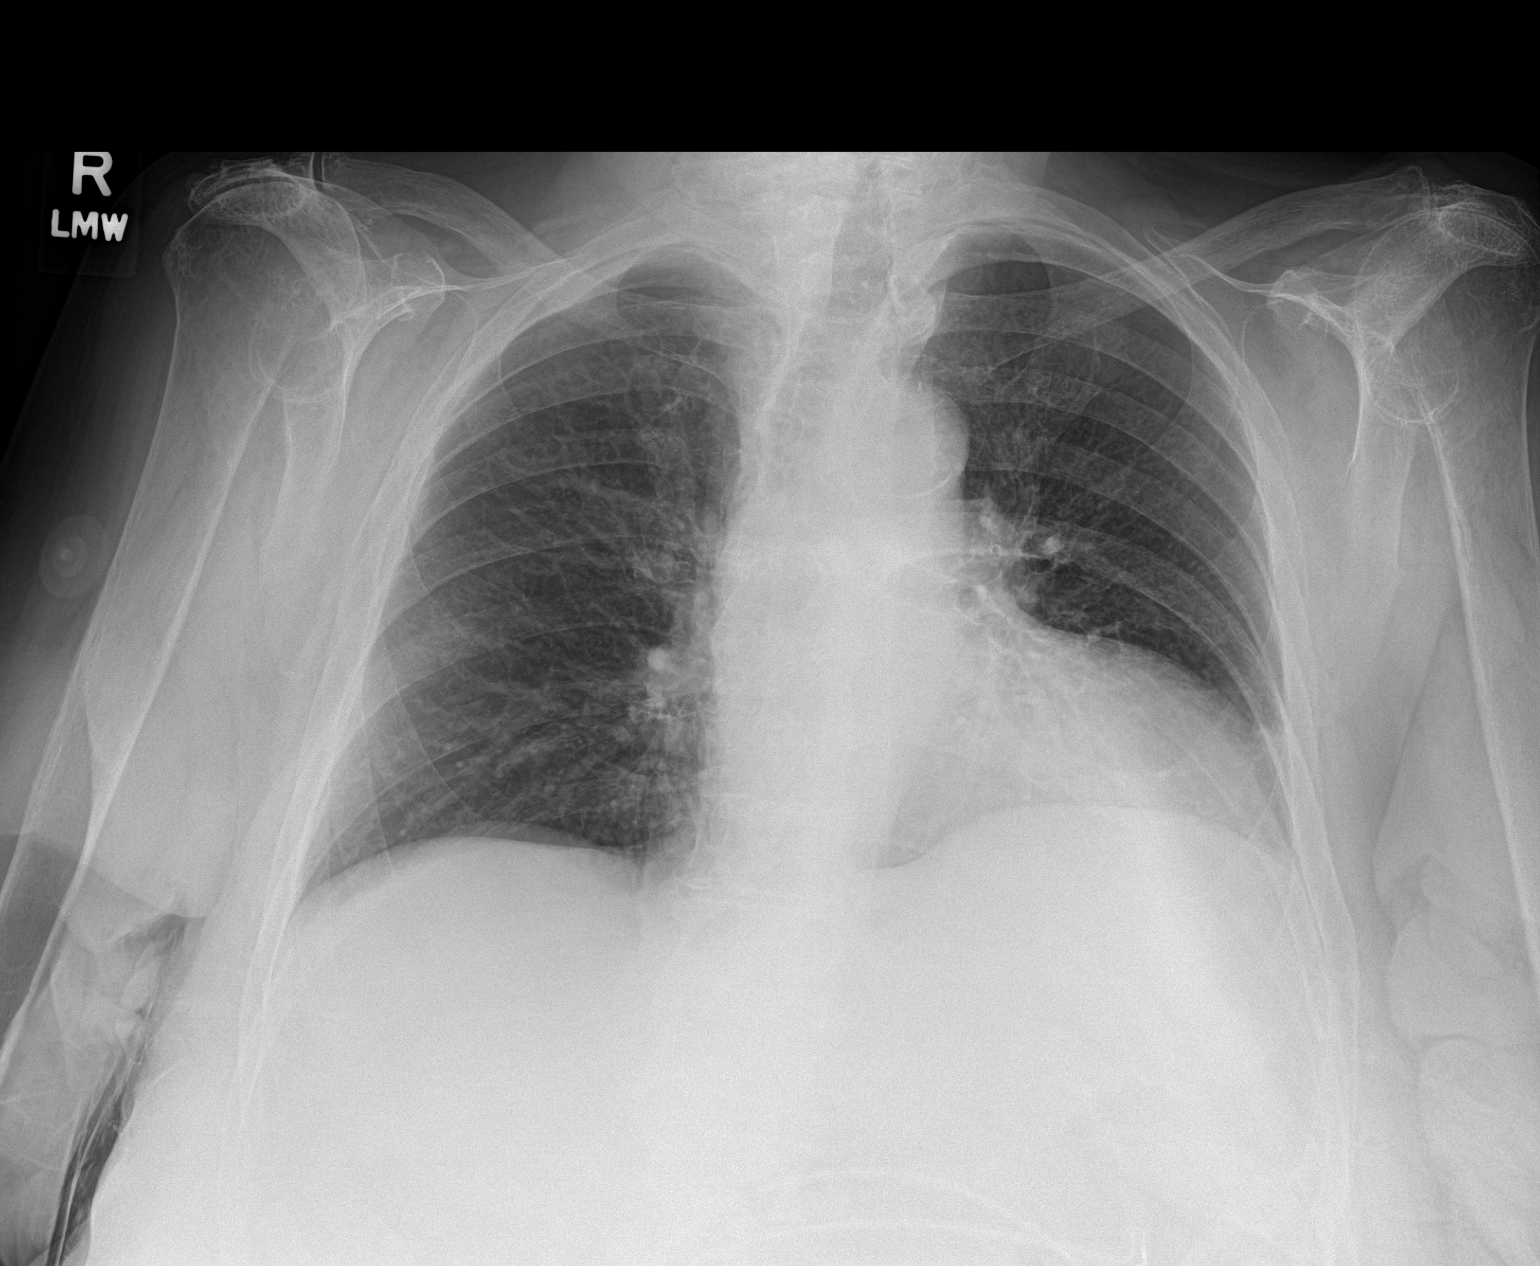

[2 of 2 positions shown; findings below may reference images not displayed]

FINDINGS: The lungs are well-aerated. Mild bibasilar opacities may reflect
atelectasis or possibly minimal pneumonia. There is no evidence of
pleural effusion or pneumothorax.

The heart is borderline normal in size. No acute osseous
abnormalities are seen.
IMPRESSION: Mild bibasilar opacities may reflect atelectasis or possibly minimal
pneumonia.

## 2022-02-14 ENCOUNTER — Emergency Department: Payer: Medicare Other

## 2022-02-14 ENCOUNTER — Other Ambulatory Visit: Payer: Self-pay

## 2022-02-14 ENCOUNTER — Emergency Department
Admission: EM | Admit: 2022-02-14 | Discharge: 2022-02-14 | Disposition: A | Discharge status: Skilled Nursing Facility | Payer: Medicare Other | Attending: Emergency Medicine | Admitting: Emergency Medicine

## 2022-02-14 DIAGNOSIS — I509 Heart failure, unspecified: Secondary | ICD-10-CM | POA: Diagnosis not present

## 2022-02-14 DIAGNOSIS — T17308A Unspecified foreign body in larynx causing other injury, initial encounter: Secondary | ICD-10-CM

## 2022-02-14 DIAGNOSIS — R0989 Other specified symptoms and signs involving the circulatory and respiratory systems: Secondary | ICD-10-CM | POA: Diagnosis present

## 2022-02-14 DIAGNOSIS — I110 Hypertensive heart disease with heart failure: Secondary | ICD-10-CM | POA: Insufficient documentation

## 2022-02-14 DIAGNOSIS — R0689 Other abnormalities of breathing: Secondary | ICD-10-CM | POA: Insufficient documentation

## 2022-02-14 DIAGNOSIS — F0390 Unspecified dementia without behavioral disturbance: Secondary | ICD-10-CM | POA: Insufficient documentation

## 2022-02-14 DIAGNOSIS — R0789 Other chest pain: Secondary | ICD-10-CM | POA: Diagnosis not present

## 2022-02-14 NOTE — ED Provider Notes (Signed)
[facility] Provider Note    Event Date/Time   First MD Initiated Contact with Patient 02/14/22 1908     (approximate)   History   No chief complaint on file.   HPI  Victoria Johnston is a 88 y.o. female   Past medical history of prior DVT no longer on anticoagulation, dementia, hyperlipidemia, hypertension, CHF who presents to the emergency department from her nursing facility after an episode of choking while in her bed.  Staff reportedly tried to dislodge with Heimlich and then performed chest compressions though there was never any indication of pulses lost, and somewhere in the midst of those interventions the food became dislodged.  EMS reports upon their arrival patient alert, awake, no respiratory distress, with normal vital signs and no hypoxemia.  History was obtained via the patient, and EMS report.      Physical Exam   Triage Vital Signs: ED Triage Vitals  Enc Vitals Group     BP      Pulse      Resp      Temp      Temp src      SpO2      Weight      Height      Head Circumference      Peak Flow      Pain Score      Pain Loc      Pain Edu?      Excl. in [place]?     Most recent vital signs: There were no vitals filed for this visit.  General: Awake, no distress.  Alert, conversant, pleasant and smiling at this provider. CV:  Good peripheral perfusion.  Resp:  Normal effort.  Breathing comfortably, lungs clear. Abd:  No distention.  Tender chest wall anterior, abdomen is soft w/o rigidity or guarding.   ED Results / Procedures / Treatments   Labs (all labs ordered are listed, but only abnormal results are displayed) Labs Reviewed - No data to display    RADIOLOGY I independently reviewed and interpreted chest x-ray and no obvious focalities or pneumothorax, +hypoventilation   PROCEDURES:  Critical Care performed: No  Procedures   MEDICATIONS ORDERED IN ED: Medications - No data to display   IMPRESSION / MDM  / [place] / ED COURSE  I reviewed the triage vital signs and the nursing notes.                              Differential diagnosis includes, but is not limited to, aspiration, choking now resolved, chest wall injury or fracture of ribs or sternum   MDM: Patient had a resolved choking episode that received chest compressions now with chest wall tenderness normal hemodynamics and no hypoxemia.  Check chest x-ray for evidence of aspiration, continue O2 monitoring and respiratory monitoring, and check x-ray for fractures or other chest injury.  Chest x-ray with hypoventilation and no obvious focal opacities or pneumothorax or rib fractures.  She is still breathing comfortably with no complaints, no hypoxemia, I will get a CT of the chest to further evaluate for rib fractures given chest wall tenderness to palpation after CPR and hypoventilation on chest x-ray to check for if she aspirated and has a partial obstruction.  Dispo: After careful consideration of this patient's presentation, medical and social risk factors, and evaluation in the emergency department I engaged in shared decision making with the patient  and/or their representative to consider admission or observation and this patient was ultimately dc'd because work-up unremarkable including chest x-ray and CT scan of the chest, patient remained stable no hypoxemia and no acute complaints in the emergency department after several hours of observation.   Patient's presentation is most consistent with acute presentation with potential threat to life or bodily function.       FINAL CLINICAL IMPRESSION(S) / ED DIAGNOSES   Final diagnoses:  None     Rx / DC Orders   ED Discharge Orders     None        Note:  This document was prepared using Dragon voice recognition software and may include unintentional dictation errors.    Lucillie Garfinkel, MD 02/14/22 [phone]

## 2022-02-14 NOTE — ED Notes (Signed)
[facility]  CALLED  FOR TRANSPORT TO  [facility] 

## 2023-11-05 ENCOUNTER — Emergency Department

## 2023-11-05 ENCOUNTER — Other Ambulatory Visit: Payer: Self-pay

## 2023-11-05 ENCOUNTER — Observation Stay
Admission: EM | Admit: 2023-11-05 | Discharge: 2023-11-06 | Disposition: A | Discharge status: Skilled Nursing Facility | Attending: Internal Medicine | Admitting: Internal Medicine

## 2023-11-05 DIAGNOSIS — S82202A Unspecified fracture of shaft of left tibia, initial encounter for closed fracture: Secondary | ICD-10-CM | POA: Diagnosis not present

## 2023-11-05 DIAGNOSIS — Z79899 Other long term (current) drug therapy: Secondary | ICD-10-CM | POA: Insufficient documentation

## 2023-11-05 DIAGNOSIS — D500 Iron deficiency anemia secondary to blood loss (chronic): Secondary | ICD-10-CM | POA: Diagnosis not present

## 2023-11-05 DIAGNOSIS — W19XXXA Unspecified fall, initial encounter: Secondary | ICD-10-CM | POA: Diagnosis not present

## 2023-11-05 DIAGNOSIS — F0390 Unspecified dementia without behavioral disturbance: Secondary | ICD-10-CM | POA: Diagnosis not present

## 2023-11-05 DIAGNOSIS — Z7901 Long term (current) use of anticoagulants: Secondary | ICD-10-CM | POA: Insufficient documentation

## 2023-11-05 DIAGNOSIS — Z96653 Presence of artificial knee joint, bilateral: Secondary | ICD-10-CM | POA: Insufficient documentation

## 2023-11-05 DIAGNOSIS — E039 Hypothyroidism, unspecified: Secondary | ICD-10-CM | POA: Insufficient documentation

## 2023-11-05 DIAGNOSIS — D696 Thrombocytopenia, unspecified: Secondary | ICD-10-CM | POA: Insufficient documentation

## 2023-11-05 DIAGNOSIS — I959 Hypotension, unspecified: Principal | ICD-10-CM | POA: Insufficient documentation

## 2023-11-05 DIAGNOSIS — I130 Hypertensive heart and chronic kidney disease with heart failure and stage 1 through stage 4 chronic kidney disease, or unspecified chronic kidney disease: Secondary | ICD-10-CM | POA: Insufficient documentation

## 2023-11-05 DIAGNOSIS — N390 Urinary tract infection, site not specified: Secondary | ICD-10-CM | POA: Insufficient documentation

## 2023-11-05 DIAGNOSIS — F32A Depression, unspecified: Secondary | ICD-10-CM | POA: Diagnosis not present

## 2023-11-05 DIAGNOSIS — N182 Chronic kidney disease, stage 2 (mild): Secondary | ICD-10-CM | POA: Insufficient documentation

## 2023-11-05 DIAGNOSIS — Z86718 Personal history of other venous thrombosis and embolism: Secondary | ICD-10-CM | POA: Insufficient documentation

## 2023-11-05 DIAGNOSIS — J45909 Unspecified asthma, uncomplicated: Secondary | ICD-10-CM | POA: Diagnosis not present

## 2023-11-05 DIAGNOSIS — S82402A Unspecified fracture of shaft of left fibula, initial encounter for closed fracture: Secondary | ICD-10-CM | POA: Insufficient documentation

## 2023-11-05 DIAGNOSIS — E861 Hypovolemia: Secondary | ICD-10-CM

## 2023-11-05 DIAGNOSIS — I509 Heart failure, unspecified: Secondary | ICD-10-CM | POA: Diagnosis not present

## 2023-11-05 LAB — COMPREHENSIVE METABOLIC PANEL WITH GFR
ALT: 11 U/L (ref 0–44)
AST: 17 U/L (ref 15–41)
Albumin: 2.4 g/dL — ABNORMAL LOW (ref 3.5–5.0)
Alkaline Phosphatase: 66 U/L (ref 38–126)
Anion gap: 9 (ref 5–15)
BUN: 49 mg/dL — ABNORMAL HIGH (ref 8–23)
CO2: 27 mmol/L (ref 22–32)
Calcium: 7.9 mg/dL — ABNORMAL LOW (ref 8.9–10.3)
Chloride: 106 mmol/L (ref 98–111)
Creatinine, Ser: 1.15 mg/dL — ABNORMAL HIGH (ref 0.44–1.00)
GFR, Estimated: 43 mL/min — ABNORMAL LOW (ref >60)
Glucose, Bld: 98 mg/dL (ref 70–99)
Potassium: 4.3 mmol/L (ref 3.5–5.1)
Sodium: 142 mmol/L (ref 135–145)
Total Bilirubin: 0.6 mg/dL (ref 0.0–1.2)
Total Protein: 4.5 g/dL — ABNORMAL LOW (ref 6.5–8.1)

## 2023-11-05 LAB — CBC WITH DIFFERENTIAL/PLATELET
Abs Immature Granulocytes: 0.04 K/uL (ref 0.00–0.07)
Basophils Absolute: 0 K/uL (ref 0.0–0.1)
Basophils Relative: 0 %
Eosinophils Absolute: 0.1 K/uL (ref 0.0–0.5)
Eosinophils Relative: 1 %
HCT: 30.8 % — ABNORMAL LOW (ref 36.0–46.0)
Hemoglobin: 9.6 g/dL — ABNORMAL LOW (ref 12.0–15.0)
Immature Granulocytes: 1 %
Lymphocytes Relative: 20 %
Lymphs Abs: 1.8 K/uL (ref 0.7–4.0)
MCH: 30.2 pg (ref 26.0–34.0)
MCHC: 31.2 g/dL (ref 30.0–36.0)
MCV: 96.9 fL (ref 80.0–100.0)
Monocytes Absolute: 0.8 K/uL (ref 0.1–1.0)
Monocytes Relative: 9 %
Neutro Abs: 6.1 K/uL (ref 1.7–7.7)
Neutrophils Relative %: 69 %
Platelets: 158 K/uL (ref 150–400)
RBC: 3.18 MIL/uL — ABNORMAL LOW (ref 3.87–5.11)
RDW: 15.7 % — ABNORMAL HIGH (ref 11.5–15.5)
WBC: 8.8 K/uL (ref 4.0–10.5)
nRBC: 0 % (ref 0.0–0.2)

## 2023-11-05 LAB — URINALYSIS, ROUTINE W REFLEX MICROSCOPIC
Bilirubin Urine: NEGATIVE
Glucose, UA: NEGATIVE mg/dL
Hgb urine dipstick: NEGATIVE
Ketones, ur: NEGATIVE mg/dL
Nitrite: NEGATIVE
Protein, ur: NEGATIVE mg/dL
Specific Gravity, Urine: 1.01 (ref 1.005–1.030)
pH: 5 (ref 5.0–8.0)

## 2023-11-05 LAB — SAMPLE TO BLOOD BANK

## 2023-11-05 LAB — LACTIC ACID, PLASMA: Lactic Acid, Venous: 1.4 mmol/L (ref 0.5–1.9)

## 2023-11-05 LAB — TROPONIN I (HIGH SENSITIVITY): Troponin I (High Sensitivity): 12 ng/L (ref <18)

## 2023-11-05 MED ORDER — SODIUM CHLORIDE 0.9 % IV BOLUS
500.0000 mL | Freq: Once | INTRAVENOUS | Status: AC
  Administered 2023-11-05: 500 mL via INTRAVENOUS

## 2023-11-05 NOTE — ED Provider Notes (Signed)
 [facility] Provider Note    Event Date/Time   First MD Initiated Contact with Patient 11/05/23 1653     (approximate)   History   Leg Injury   HPI  Victoria Johnston is a 89 y.o. female with a history of CHF, hypertension, hyperlipidemia, DVT, Crohn's disease, GERD who presents from a facility with an apparent leg injury.  Per EMS, the staff was unaware of any falls and does not know when the injury happened.  She was noted to have a deformity of her left lower leg.  The patient herself has dementia and is unable to give any history.  I reviewed the past medical records; the patient's last records in our system are from 2017.  She was admitted to the hospitalist service in January 2017 for altered mental status thought to be due to acute delirium superimposed on chronic dementia.   Physical Exam   Triage Vital Signs: ED Triage Vitals  Encounter Vitals Group     BP      Girls Systolic BP Percentile      Girls Diastolic BP Percentile      Boys Systolic BP Percentile      Boys Diastolic BP Percentile      Pulse      Resp      Temp      Temp src      SpO2      Weight      Height      Head Circumference      Peak Flow      Pain Score      Pain Loc      Pain Education      Exclude from Growth Chart     Most recent vital signs: Vitals:   11/05/23 2045 11/05/23 2140  BP: 109/68 (!) 98/56  Pulse: 89 (!) 103  Resp: 14 15  Temp:  98 F (36.7 C)  SpO2: 99% 92%     General: Alert, oriented x 1, no distress.  CV:  Good peripheral perfusion.  Resp:  Normal effort.  Abd:  No distention.  Other:  Left distal lower leg deformity.  1+ DP pulse.  Normal cap refill distally.  Motor intact in all extremities.   ED Results / Procedures / Treatments   Labs (all labs ordered are listed, but only abnormal results are displayed) Labs Reviewed  COMPREHENSIVE METABOLIC PANEL WITH GFR - Abnormal; Notable for the following components:      Result  Value   BUN 49 (*)    Creatinine, Ser 1.15 (*)    Calcium  7.9 (*)    Total Protein 4.5 (*)    Albumin 2.4 (*)    GFR, Estimated 43 (*)    All other components within normal limits  CBC WITH DIFFERENTIAL/PLATELET - Abnormal; Notable for the following components:   RBC 3.18 (*)    Hemoglobin 9.6 (*)    HCT 30.8 (*)    RDW 15.7 (*)    All other components within normal limits  URINALYSIS, ROUTINE W REFLEX MICROSCOPIC - Abnormal; Notable for the following components:   Color, Urine STRAW (*)    APPearance CLEAR (*)    Leukocytes,Ua LARGE (*)    Bacteria, UA MANY (*)    All other components within normal limits  LACTIC ACID, PLASMA  SAMPLE TO BLOOD BANK  TROPONIN I (HIGH SENSITIVITY)     EKG  ED ECG REPORT I, Waylon Cassis, the attending physician, personally  viewed and interpreted this ECG.  Date: 11/05/2023 EKG Time: 1756 Rate: 90 Rhythm: Irregular rhythm, possibly atrial fibrillation QRS Axis: normal Intervals: normal ST/T Wave abnormalities: Nonspecific ST abnormality Narrative Interpretation: no evidence of acute ischemia    RADIOLOGY  XR L tibia/fibula: Mildly displaced distal tibia/fibula fracture  PROCEDURES:  Critical Care performed: No  .Ortho Injury Treatment  Date/Time: 11/05/2023 11:33 PM  Performed by: Jacolyn Pae, MD Authorized by: Jacolyn Pae, MD   Consent:    Consent obtained:  Verbal   Consent given by:  Healthcare agent   Risks discussed:  Nerve damage, restricted joint movement and vascular damage   Alternatives discussed:  ImmobilizationInjury location: lower leg Location details: left lower leg Injury type: fracture Fracture type: tibial and fibular shafts Pre-procedure neurovascular assessment: neurovascularly intact  Anesthesia: Local anesthesia used: no  Patient sedated: NoImmobilization: splint Splint type: ankle stirrup and short leg Splint Applied by: ED Provider Supplies used:  Ortho-Glass Post-procedure neurovascular assessment: post-procedure neurovascularly intact      MEDICATIONS ORDERED IN ED: Medications  sodium chloride  0.9 % bolus 500 mL (0 mLs Intravenous Stopped 11/05/23 1757)  sodium chloride  0.9 % bolus 500 mL (0 mLs Intravenous Stopped 11/05/23 2319)     IMPRESSION / MDM / ASSESSMENT AND PLAN / ED COURSE  I reviewed the triage vital signs and the nursing notes.  89 year old female with PMH as noted above presents with a left lower leg deformity; the history of the injury is unknown.  She has dementia and is unable to give any other history.  She is bedbound.  On exam there is a deformity of the left lower leg but the distal lower leg/foot appears neuro/vascular intact.  The patient is hypotensive with otherwise normal vital signs.  Differential diagnosis includes, but is not limited to:  Leg injury: Fracture, contusion.  We will obtain x-rays.  Hypotension: Medication side effect (the patient was given fentanyl  while en route) dehydration, hypovolemia, sepsis, cardiac etiology.  We will give a fluid bolus and obtain lab workup.  Patient's presentation is most consistent with acute presentation with potential threat to life or bodily function.  The patient is on the cardiac monitor to evaluate for evidence of arrhythmia and/or significant heart rate changes.  ----------------------------------------- 10:07 PM on 11/05/2023 -----------------------------------------  X-rays confirm a mildly displaced tibia/fibula fracture.  I consulted Dr. Lorelle from orthopedics who recommended reduction and splinting, which I performed.  Postreduction x-rays confirm improved alignment.  Lab workup is overall unremarkable.  Lactate and troponin are both negative.  CMP shows no acute findings.  CBC shows no leukocytosis, and chronic anemia.  The patient has remained persistently borderline hypotensive.  I have ordered additional fluids.  She will need admission  for further management.  I consulted Dr. Lawence from the hospitalist service; based on our discussion he agrees to evaluate the patient for admission.   FINAL CLINICAL IMPRESSION(S) / ED DIAGNOSES   Final diagnoses:  Closed fracture of left tibia and fibula, initial encounter  Hypotension, unspecified hypotension type     Rx / DC Orders   ED Discharge Orders     None        Note:  This document was prepared using Dragon voice recognition software and may include unintentional dictation errors.    Jacolyn Pae, MD 11/05/23 [phone]

## 2023-11-05 NOTE — ED Triage Notes (Signed)
 Pt to ED via [facility] from [facility] in [place]. Per EMS pt has confirmed fracture in L leg. Per EMS staff at facility denied knowing how injury occurred. Staff denied recent falls. Pt oriented to self at this time and per EMS, this is pt's baseline.EMS gave 50 mcg of versed  PTA. Pt had 800 NS prior to their arrival.

## 2023-11-05 NOTE — ED Notes (Signed)
 Splint placed with Jacolyn, MD, Izell HEYS, [place], [place], and [place], [place] and this RN.

## 2023-11-05 NOTE — H&P (Incomplete)
 [place]   PATIENT NAME: Stephine Akey    MR#:  [id number]  DATE OF BIRTH:  1924/03/19  DATE OF ADMISSION:  11/05/2023  PRIMARY CARE PHYSICIAN: Cleotilde Oneil FALCON, MD   Patient is coming from: Home  REQUESTING/REFERRING PHYSICIAN: Jacolyn Pae, MD  CHIEF COMPLAINT:   Chief Complaint  Patient presents with   Leg Injury    HISTORY OF PRESENT ILLNESS:  Amerah C Nealy is a 89 y.o. female with medical history significant for anxiety, CHF, asthma, Crohn's disease, GERD, DVT, dyslipidemia and hypertension, who presented to the emergency room with acute onset of fall yesterday with subsequent left leg pain.  The patient is bed to wheelchair at baseline.  She was noted to be hypotensive.  She denied any chest pain or palpitations.  No cough or wheezing or mops this.  No chest pain or palpitations.  No dysuria, oliguria or hematuria or flank pain.  She is overall poor historian.  No bleeding diathesis.  ED Course: When she came to the ER BUN was 49 with a creatinine 1.15 with calcium  7.9 and albumin 2.4 with total protein of 4.5 high since the troponin I was 12 and lactic acid 1.4.  CBC showed anemia with hemoglobin 9.6 hematocrit 30.8.  UA was positive for UTI. EKG as reviewed by me: EKG showed suspected atrial fibrillation with controlled rate response of 90, inferior and anterolateral Q waves. Imaging: Left tibia/fibula x-ray showed acute comminuted fracture of the distal tibia and fibula with residual mild displacement and angulation.  The patient was given 500 mL IV normal saline bolus twice.  I added IV Rocephin .  She will be admitted to a medical telemetry bed for further evaluation and management. PAST MEDICAL HISTORY:   Past Medical History:  Diagnosis Date   Anxiety    Bruises easily    CHF (congestive heart failure) (HCC)    Chronic obstructive asthma (HCC)    Crohn disease (HCC)    Difficulty breathing    DVT (deep vein thrombosis) in pregnancy    GERD  (gastroesophageal reflux disease)    Hearing loss    HLD (hyperlipidemia)    HTN (hypertension)    OA (osteoarthritis)    Raynaud disease    Swelling    Tremors of nervous system     PAST SURGICAL HISTORY:   Past Surgical History:  Procedure Laterality Date   ABDOMINAL HYSTERECTOMY     FOOT SURGERY Bilateral    KNEE SURGERY Bilateral    ORIF FEMUR FRACTURE Right 01/22/2015   Procedure: OPEN REDUCTION INTERNAL FIXATION (ORIF) DISTAL FEMUR FRACTURE;  Surgeon: Ozell Flake, MD;  Location: [facility] ORS;  Service: Orthopedics;  Laterality: Right;    SOCIAL HISTORY:   Social History   Tobacco Use   Smoking status: Never   Smokeless tobacco: Never  Substance Use Topics   Alcohol use: No    FAMILY HISTORY:   Family History  Problem Relation Age of Onset   CAD Other    Diabetes Other    Hypertension Other     DRUG ALLERGIES:   Allergies  Allergen Reactions   Codeine Nausea Only   Lexapro [Escitalopram Oxalate] Other (See Comments)    Reaction: unknown   Lyrica [Pregabalin] Other (See Comments)    Reaction: unknown   Mysoline [Primidone] Other (See Comments)    Reaction: unknown    REVIEW OF SYSTEMS:   ROS As per history of present illness. All pertinent systems were reviewed above. Constitutional, HEENT,  cardiovascular, respiratory, GI, GU, musculoskeletal, neuro, psychiatric, endocrine, integumentary and hematologic systems were reviewed and are otherwise negative/unremarkable except for positive findings mentioned above in the HPI.   MEDICATIONS AT HOME:   Prior to Admission medications   Medication Sig Start Date End Date Taking? Authorizing Provider  acetaminophen  (TYLENOL ) 325 MG tablet Take 2 tablets (650 mg total) by mouth every 6 (six) hours as needed for mild pain (or Fever >/= 101). Patient not taking: Reported on 03/12/2015 01/24/15   Cleotilde Oneil FALCON, MD  bisacodyl  (DULCOLAX) 10 MG suppository Place 1 suppository (10 mg total) rectally daily as needed for  moderate constipation. Patient not taking: Reported on 03/12/2015 01/24/15   Cleotilde Oneil FALCON, MD  docusate sodium  (COLACE) 100 MG capsule Take 1 capsule (100 mg total) by mouth 2 (two) times daily. Patient not taking: Reported on 05/17/2015 01/24/15   Cleotilde Oneil FALCON, MD  doxepin  (SINEQUAN ) 10 MG capsule Take 1 capsule by mouth daily. 05/05/15   [provider]  Iron -Vitamin C  (VITRON-C) 65-125 MG TABS Take 1 tablet by mouth daily.    [provider]  levothyroxine  (SYNTHROID , LEVOTHROID) 75 MCG tablet Take 75 mcg by mouth daily before breakfast.    [provider]  LORazepam  (ATIVAN ) 1 MG tablet Take 1 tablet (1 mg total) by mouth every 8 (eight) hours as needed for anxiety. 01/24/15   Cleotilde Oneil FALCON, MD  nitroGLYCERIN  (NITROSTAT ) 0.4 MG SL tablet Place 0.4 mg under the tongue every 5 (five) minutes as needed for chest pain.    [provider]  pantoprazole  (PROTONIX ) 40 MG tablet Take 40 mg by mouth 2 (two) times daily.    [provider]  propranolol  (INDERAL ) 40 MG tablet Take 20 mg by mouth daily.     [provider]  risperiDONE  (RISPERDAL  M-TABS) 0.5 MG disintegrating tablet Take 1 tablet (0.5 mg total) by mouth 2 (two) times daily. 05/19/15   Barbette Cea, MD      VITAL SIGNS:  Blood pressure (!) 97/57, pulse 91, temperature 98.2 F (36.8 C), temperature source Oral, resp. rate 14, height 5' 3 (1.6 m), weight 75.3 kg, SpO2 96%.  PHYSICAL EXAMINATION:  Physical Exam  GENERAL:  89 y.o.-year-old patient lying in the bed with no acute distress.  EYES: Pupils equal, round, reactive to light and accommodation. No scleral icterus. Extraocular muscles intact.  HEENT: Head atraumatic, normocephalic. Oropharynx and nasopharynx clear.  NECK:  Supple, no jugular venous distention. No thyroid  enlargement, no tenderness.  LUNGS: Normal breath sounds bilaterally, no wheezing, rales,rhonchi or crepitation. No use of accessory muscles of respiration.   CARDIOVASCULAR: Regular rate and rhythm, S1, S2 normal. No murmurs, rubs, or gallops.  ABDOMEN: Soft, nondistended, nontender. Bowel sounds present. No organomegaly or mass.  EXTREMITIES: No pedal edema, cyanosis, or clubbing.  NEUROLOGIC: Cranial nerves II through XII are intact. Muscle strength 5/5 in all extremities. Sensation intact. Gait not checked. Musculoskeletal: Left leg splinted after reduction. PSYCHIATRIC: The patient is alert and oriented x 3.  Normal affect and good eye contact. SKIN: No obvious rash, lesion, or ulcer.   LABORATORY PANEL:   CBC Recent Labs  Lab 11/06/23 0419  WBC 6.3  HGB 8.6*  HCT 27.9*  PLT 139*   ------------------------------------------------------------------------------------------------------------------  Chemistries  Recent Labs  Lab 11/05/23 1713 11/06/23 0419  NA 142 143  K 4.3 4.3  CL 106 109  CO2 27 26  GLUCOSE 98 91  BUN 49* 46*  CREATININE 1.15* 0.92  CALCIUM  7.9*  8.0*  AST 17  --   ALT 11  --   ALKPHOS 66  --   BILITOT 0.6  --    ------------------------------------------------------------------------------------------------------------------  Cardiac Enzymes No results for input(s): TROPONINI in the last 168 hours. ------------------------------------------------------------------------------------------------------------------  RADIOLOGY:  DG Tibia/Fibula Left Result Date: 11/05/2023 CLINICAL DATA:  Post reduction EXAM: LEFT TIBIA AND FIBULA - 2 VIEW COMPARISON:  11/05/2023 FINDINGS: Osteopenia. Casting material limits bone detail. Left knee replacement. Extensive vascular calcifications. Acute mildly comminuted fracture distal shaft of fibula with about 1/4 shaft diameter lateral and anterior displacement of distal fracture fragment, mild apex posterior angulation. Acute comminuted fracture involving the shaft and metaphysis of the distal tibia with residual mild lateral angulation of distal fracture fragment and  less than 1/4 shaft diameter lateral and anterior displacement of distal fracture fragment. IMPRESSION: Acute comminuted fractures of distal tibia and fibula with residual mild displacement and angulation. Electronically Signed   By: Luke Bun M.D.   On: 11/05/2023 21:12   DG Tibia/Fibula Left Result Date: 11/05/2023 CLINICAL DATA:  Deformity, possible twist or fall. EXAM: LEFT TIBIA AND FIBULA - 2 VIEW COMPARISON:  None Available. FINDINGS: There is diffusely decreased mineralization of the bones. There are mildly displaced comminuted fractures of the distal tibia and fibula shafts. No dislocation is seen. Total knee arthroplasty changes are present without evidence of hardware loosening. No joint effusion is seen at the knee. Vascular calcifications are present in the soft tissues. IMPRESSION: Mildly displaced comminuted fractures of the distal tibia and fibula. Electronically Signed   By: Leita Birmingham M.D.   On: 11/05/2023 18:10      IMPRESSION AND PLAN:  Assessment and Plan: * Hypotension - This is likely secondary to volume depletion and dehydration. - The patient be admitted to the medical telemetry bed. - Will continue hydration with IV normal saline. - Will monitor BP. - Will hold off antihypertensive therapy.  Closed fracture of left tibia and fibula - Status post reduction and splinting. - Orthopedic follow-up can be obtained later this admission or on outpatient basis. - Pain management will be provided  Acute lower UTI - Will continue IV Rocephin  and follow urine culture and sensitivity.  History of DVT (deep vein thrombosis) - Will continue Eliquis   Depression - Will continue Zoloft.  Hypothyroidism - Will continue Synthroid .    DVT prophylaxis: Eliquis .  Advanced Care Planning:  Code Status: full code.  Family Communication:  The plan of care was discussed in details with the patient (and family). I answered all questions. The patient agreed to proceed with  the above mentioned plan. Further management will depend upon hospital course. Disposition Plan: Back to previous home environment Consults called: none.  All the records are reviewed and case discussed with ED provider.  Status is: Inpatient  At the time of the admission, it appears that the appropriate admission status for this patient is inpatient.  This is judged to be reasonable and necessary in order to provide the required intensity of service to ensure the patient's safety given the presenting symptoms, physical exam findings and initial radiographic and laboratory data in the context of comorbid conditions.  The patient requires inpatient status due to high intensity of service, high risk of further deterioration and high frequency of surveillance required.  I certify that at the time of admission, it is my clinical judgment that the patient will require inpatient hospital care extending more than 2 midnights.  Dispo: The patient is from: Home              Anticipated d/c is to: Home              Patient currently is not medically stable to d/c.              Difficult to place patient: No  [place] DELENA Peaches M.D on 11/06/2023 at 5:54 AM  [facility]   From 7 PM-7 AM, contact night-coverage [email or web address]  CC: Primary care physician; Cleotilde Oneil FALCON, MD

## 2023-11-06 DIAGNOSIS — N390 Urinary tract infection, site not specified: Secondary | ICD-10-CM | POA: Diagnosis not present

## 2023-11-06 DIAGNOSIS — I959 Hypotension, unspecified: Secondary | ICD-10-CM | POA: Diagnosis not present

## 2023-11-06 DIAGNOSIS — F32A Depression, unspecified: Secondary | ICD-10-CM | POA: Insufficient documentation

## 2023-11-06 DIAGNOSIS — I9589 Other hypotension: Secondary | ICD-10-CM

## 2023-11-06 DIAGNOSIS — S82402A Unspecified fracture of shaft of left fibula, initial encounter for closed fracture: Secondary | ICD-10-CM | POA: Diagnosis not present

## 2023-11-06 DIAGNOSIS — Z86718 Personal history of other venous thrombosis and embolism: Secondary | ICD-10-CM

## 2023-11-06 DIAGNOSIS — S82202A Unspecified fracture of shaft of left tibia, initial encounter for closed fracture: Secondary | ICD-10-CM | POA: Diagnosis not present

## 2023-11-06 LAB — BASIC METABOLIC PANEL WITH GFR
Anion gap: 8 (ref 5–15)
BUN: 46 mg/dL — ABNORMAL HIGH (ref 8–23)
CO2: 26 mmol/L (ref 22–32)
Calcium: 8 mg/dL — ABNORMAL LOW (ref 8.9–10.3)
Chloride: 109 mmol/L (ref 98–111)
Creatinine, Ser: 0.92 mg/dL (ref 0.44–1.00)
GFR, Estimated: 56 mL/min — ABNORMAL LOW (ref >60)
Glucose, Bld: 91 mg/dL (ref 70–99)
Potassium: 4.3 mmol/L (ref 3.5–5.1)
Sodium: 143 mmol/L (ref 135–145)

## 2023-11-06 LAB — CBC
HCT: 27.9 % — ABNORMAL LOW (ref 36.0–46.0)
Hemoglobin: 8.6 g/dL — ABNORMAL LOW (ref 12.0–15.0)
MCH: 29.4 pg (ref 26.0–34.0)
MCHC: 30.8 g/dL (ref 30.0–36.0)
MCV: 95.2 fL (ref 80.0–100.0)
Platelets: 139 K/uL — ABNORMAL LOW (ref 150–400)
RBC: 2.93 MIL/uL — ABNORMAL LOW (ref 3.87–5.11)
RDW: 15.8 % — ABNORMAL HIGH (ref 11.5–15.5)
WBC: 6.3 K/uL (ref 4.0–10.5)
nRBC: 0 % (ref 0.0–0.2)

## 2023-11-06 LAB — IRON AND TIBC
Iron: 23 ug/dL — ABNORMAL LOW (ref 28–170)
Saturation Ratios: 10 % — ABNORMAL LOW (ref 10.4–31.8)
TIBC: 232 ug/dL — ABNORMAL LOW (ref 250–450)
UIBC: 209 ug/dL

## 2023-11-06 LAB — FERRITIN: Ferritin: 26 ng/mL (ref 11–307)

## 2023-11-06 MED ORDER — SODIUM CHLORIDE 0.9 % IV SOLN
1.0000 g | INTRAVENOUS | Status: DC
  Administered 2023-11-06: 1 g via INTRAVENOUS
  Filled 2023-11-06: qty 10

## 2023-11-06 MED ORDER — MIDODRINE HCL 5 MG PO TABS
5.0000 mg | ORAL_TABLET | Freq: Three times a day (TID) | ORAL | Status: DC
  Administered 2023-11-06: 5 mg via ORAL
  Filled 2023-11-06: qty 1

## 2023-11-06 MED ORDER — ACETAMINOPHEN 325 MG PO TABS
650.0000 mg | ORAL_TABLET | Freq: Four times a day (QID) | ORAL | Status: DC | PRN
  Administered 2023-11-06: 650 mg via ORAL
  Filled 2023-11-06: qty 2

## 2023-11-06 MED ORDER — SODIUM CHLORIDE 0.9 % IV SOLN: INTRAVENOUS | Status: DC

## 2023-11-06 MED ORDER — TRAZODONE HCL 50 MG PO TABS: 25.0000 mg | ORAL_TABLET | Freq: Every evening | ORAL | Status: DC | PRN

## 2023-11-06 MED ORDER — OXYCODONE-ACETAMINOPHEN 5-325 MG PO TABS: 0.5000 | ORAL_TABLET | ORAL | 0 refills | Status: DC | PRN

## 2023-11-06 MED ORDER — ENOXAPARIN SODIUM 30 MG/0.3ML IJ SOSY: 30.0000 mg | PREFILLED_SYRINGE | INTRAMUSCULAR | Status: DC

## 2023-11-06 MED ORDER — GABAPENTIN 100 MG PO CAPS: 100.0000 mg | ORAL_CAPSULE | Freq: Every day | ORAL | Status: DC

## 2023-11-06 MED ORDER — CALCIUM CARBONATE 1250 (500 CA) MG PO TABS
1.0000 | ORAL_TABLET | Freq: Every day | ORAL | Status: DC
  Administered 2023-11-06: 1250 mg via ORAL
  Filled 2023-11-06: qty 1

## 2023-11-06 MED ORDER — POLYSACCHARIDE IRON COMPLEX 150 MG PO TABS: 150.0000 mg | ORAL_TABLET | Freq: Every day | ORAL | Status: DC

## 2023-11-06 MED ORDER — DIVALPROEX SODIUM 125 MG PO CSDR
125.0000 mg | DELAYED_RELEASE_CAPSULE | Freq: Every day | ORAL | Status: DC
  Filled 2023-11-06: qty 1

## 2023-11-06 MED ORDER — PANTOPRAZOLE SODIUM 40 MG PO TBEC: 40.0000 mg | DELAYED_RELEASE_TABLET | Freq: Two times a day (BID) | ORAL | Status: DC

## 2023-11-06 MED ORDER — VITAMIN D 25 MCG (1000 UNIT) PO TABS
1000.0000 [IU] | ORAL_TABLET | Freq: Every day | ORAL | Status: DC
  Administered 2023-11-06: 1000 [IU] via ORAL
  Filled 2023-11-06: qty 1

## 2023-11-06 MED ORDER — SODIUM CHLORIDE 0.9 % IV BOLUS
250.0000 mL | Freq: Once | INTRAVENOUS | Status: AC
  Administered 2023-11-06: 250 mL via INTRAVENOUS

## 2023-11-06 MED ORDER — PROPRANOLOL HCL 20 MG PO TABS: 20.0000 mg | ORAL_TABLET | Freq: Every day | ORAL | Status: DC

## 2023-11-06 MED ORDER — MIDODRINE HCL 5 MG PO TABS: 5.0000 mg | ORAL_TABLET | Freq: Three times a day (TID) | ORAL | Status: DC

## 2023-11-06 MED ORDER — POLYETHYLENE GLYCOL 3350 17 G PO PACK
17.0000 g | PACK | Freq: Every day | ORAL | Status: DC
  Administered 2023-11-06: 17 g via ORAL
  Filled 2023-11-06: qty 1

## 2023-11-06 MED ORDER — IRON-VITAMIN C 65-125 MG PO TABS: 1.0000 | ORAL_TABLET | Freq: Every day | ORAL | Status: DC

## 2023-11-06 MED ORDER — METOPROLOL SUCCINATE ER 25 MG PO TB24
25.0000 mg | ORAL_TABLET | Freq: Every day | ORAL | Status: DC
  Filled 2023-11-06: qty 1

## 2023-11-06 MED ORDER — NITROGLYCERIN 0.4 MG SL SUBL: 0.4000 mg | SUBLINGUAL_TABLET | SUBLINGUAL | Status: DC | PRN

## 2023-11-06 MED ORDER — MAGNESIUM HYDROXIDE 400 MG/5ML PO SUSP
30.0000 mL | Freq: Every day | ORAL | Status: DC | PRN
Start: 2023-11-06 — End: 2023-11-06

## 2023-11-06 MED ORDER — APIXABAN 5 MG PO TABS
5.0000 mg | ORAL_TABLET | Freq: Two times a day (BID) | ORAL | Status: DC
  Administered 2023-11-06: 5 mg via ORAL
  Filled 2023-11-06: qty 1

## 2023-11-06 MED ORDER — ONDANSETRON HCL 4 MG/2ML IJ SOLN: 4.0000 mg | Freq: Four times a day (QID) | INTRAMUSCULAR | Status: DC | PRN

## 2023-11-06 MED ORDER — ONDANSETRON HCL 4 MG PO TABS: 4.0000 mg | ORAL_TABLET | Freq: Four times a day (QID) | ORAL | Status: DC | PRN

## 2023-11-06 MED ORDER — FOSFOMYCIN TROMETHAMINE 3 G PO PACK
3.0000 g | PACK | Freq: Once | ORAL | Status: AC
  Administered 2023-11-06: 3 g via ORAL
  Filled 2023-11-06: qty 3

## 2023-11-06 MED ORDER — ACETAMINOPHEN 325 MG RE SUPP: 650.0000 mg | Freq: Four times a day (QID) | RECTAL | Status: DC | PRN

## 2023-11-06 MED ORDER — DOXEPIN HCL 10 MG PO CAPS: 10.0000 mg | ORAL_CAPSULE | Freq: Every day | ORAL | Status: DC

## 2023-11-06 MED ORDER — LORAZEPAM 1 MG PO TABS: 1.0000 mg | ORAL_TABLET | Freq: Three times a day (TID) | ORAL | Status: DC | PRN

## 2023-11-06 MED ORDER — KETOROLAC TROMETHAMINE 15 MG/ML IJ SOLN
15.0000 mg | Freq: Four times a day (QID) | INTRAMUSCULAR | Status: DC | PRN
  Administered 2023-11-06: 15 mg via INTRAVENOUS
  Filled 2023-11-06: qty 1

## 2023-11-06 MED ORDER — LEVOTHYROXINE SODIUM 50 MCG PO TABS
75.0000 ug | ORAL_TABLET | Freq: Every day | ORAL | Status: DC
  Filled 2023-11-06: qty 2

## 2023-11-06 MED ORDER — SODIUM CHLORIDE 0.9 % IV BOLUS: 500.0000 mL | Freq: Once | INTRAVENOUS | Status: DC

## 2023-11-06 MED ORDER — RISPERIDONE 1 MG PO TBDP: 0.5000 mg | ORAL_TABLET | Freq: Two times a day (BID) | ORAL | Status: DC

## 2023-11-06 NOTE — Discharge Summary (Signed)
 Physician Discharge Summary   Patient: Victoria Johnston MRN: [id number] DOB: July 07, 1923  Admit date:     11/05/2023  Discharge date: 11/06/23  Discharge Physician: Murvin Mana   PCP: Cleotilde Oneil FALCON, MD   Recommendations at discharge:   Follow-up PCP in 1 week. Follow-up with orthopedics in 2 to 4 weeks  Discharge Diagnoses: Principal Problem:   Hypotension Active Problems:   Closed fracture of left tibia and fibula   History of DVT (deep vein thrombosis)   Acute lower UTI   Hypothyroidism   Depression  Resolved Problems:   * No resolved hospital problems. *  Hospital Course: Victoria Johnston is a 89 y.o. female with medical history significant for anxiety, CHF, asthma, Crohn's disease, GERD, DVT, dyslipidemia and hypertension, who presented to the emergency room with acute onset of fall yesterday with subsequent left leg pain. X ray showed Left tibia/fibula x-ray showed acute comminuted fracture of the distal tibia and fibula with residual mild displacement and angulation.  Patient is bedbound at baseline, fracture happened when patient was transferred to bedside.  Patient was status post closed reduction and splinting in the emergency room.  Per orthopedics, patient does not need surgery. As a result, patient will be transferred back to nursing home.  Assessment and Plan: * Hypotension This appeared to be secondary to dehydration, pain medicine as well as blood pressure medicines..  Patient received the fluids, I also give her midodrine .  Blood pressure has been better.  Closed fracture of left tibia and fibula - Status post reduction and splinting. Discussed with orthopedics, patient will be followed by orthopedics as outpatient.  Acute lower UTI Patient does not have any change in her status until she had a fracture.  She has been treated with Rocephin .  She is not septic.  She will be receiving a dose of fosfomycin to complete the course.  Advanced dementia. Patient is  confused at baseline.  History of DVT (deep vein thrombosis) - Will continue Eliquis   Depression - Will continue Zoloft.  Hypothyroidism - Will continue Synthroid .  Iron  deficient anemia. Mild thrombocytopenia. Will add iron  supplement.  Chronic kidney disease stage II.       Consultants: None Procedures performed: Closed reduction Disposition: Long term care facility Diet recommendation:  Discharge Diet Orders (From admission, onward)     Start     Ordered   11/06/23 0000  Diet - low sodium heart healthy        11/06/23 1333           Cardiac diet DISCHARGE MEDICATION: Allergies as of 11/06/2023       Reactions   Codeine Nausea Only   Lexapro [escitalopram Oxalate] Other (See Comments)   Reaction: unknown   Lyrica [pregabalin] Other (See Comments)   Reaction: unknown   Mysoline [primidone] Other (See Comments)   Reaction: unknown        Medication List     STOP taking these medications    furosemide 20 MG tablet Commonly known as: LASIX   lisinopril 10 MG tablet Commonly known as: ZESTRIL   metoprolol  succinate 25 MG 24 hr tablet Commonly known as: TOPROL -XL       TAKE these medications    acetaminophen  325 MG tablet Commonly known as: TYLENOL  Take 2 tablets (650 mg total) by mouth every 6 (six) hours as needed for mild pain (or Fever >/= 101). What changed:  when to take this Another medication with the same name was removed. Continue taking this  medication, and follow the directions you see here.   calcium  carbonate 1500 (600 Ca) MG Tabs tablet Commonly known as: OSCAL Take 1 tablet by mouth daily.   carboxymethylcellulose 0.5 % Soln Commonly known as: REFRESH PLUS Place 1 drop into both eyes 2 (two) times daily.   cholecalciferol  25 MCG (1000 UNIT) tablet Commonly known as: VITAMIN D3 Take 1,000 Units by mouth daily.   divalproex  125 MG capsule Commonly known as: DEPAKOTE  SPRINKLE Take 125 mg by mouth at bedtime.    docusate sodium  100 MG capsule Commonly known as: COLACE Take 1 capsule (100 mg total) by mouth 2 (two) times daily. What changed: when to take this   Eliquis  5 MG Tabs tablet Generic drug: apixaban  Take 5 mg by mouth 2 (two) times daily.   gabapentin  100 MG capsule Commonly known as: NEURONTIN  Take 100 mg by mouth at bedtime.   Ingrezza 60 MG capsule Generic drug: valbenazine Take 60 mg by mouth at bedtime.   levothyroxine  75 MCG tablet Commonly known as: SYNTHROID  Take 75 mcg by mouth daily before breakfast.   midodrine  5 MG tablet Commonly known as: PROAMATINE  Take 1 tablet (5 mg total) by mouth 3 (three) times daily with meals.   nitroGLYCERIN  0.4 MG SL tablet Commonly known as: NITROSTAT  Place 0.4 mg under the tongue every 5 (five) minutes as needed for chest pain.   oxyCODONE -acetaminophen  5-325 MG tablet Commonly known as: Percocet Take 0.5-1 tablets by mouth every 4 (four) hours as needed for severe pain (pain score 7-10) or moderate pain (pain score 4-6).   polyethylene glycol 17 g packet Commonly known as: MIRALAX  / GLYCOLAX  Take 17 g by mouth daily.   potassium chloride  10 MEQ CR capsule Commonly known as: MICRO-K  Take 10 mEq by mouth daily.   PRESERVISION AREDS 2 PO Take 1 tablet by mouth 2 (two) times daily with a meal.   sertraline 25 MG tablet Commonly known as: ZOLOFT Take 75 mg by mouth in the morning.   zinc oxide 20 % ointment Apply 1 Application topically as needed for irritation or diaper changes.        Follow-up Information     Cleotilde Oneil FALCON, MD Follow up in 1 week(s).   Specialty: Internal Medicine Contact information: [facility] [address] [place] [address] [phone]         Lorelle Hussar, MD Follow up in 2 week(s).   Specialty: Orthopedic Surgery Contact information: [address] [place] [address] [phone]                Discharge Exam: Victoria Johnston   11/05/23  1704  Weight: 75.3 kg   General exam: Appears calm and comfortable  Respiratory system: Clear to auscultation. Respiratory effort normal. Cardiovascular system: S1 & S2 heard, RRR. No JVD, murmurs, rubs, gallops or clicks. No pedal edema. Gastrointestinal system: Abdomen is nondistended, soft and nontender. No organomegaly or masses felt. Normal bowel sounds heard. Central nervous system: Alert and oriented x1. No focal neurological deficits. Extremities: Symmetric 5 x 5 power. Skin: No rashes, lesions or ulcers Psychiatry: Flat affect.    Condition at discharge: fair  The results of significant diagnostics from this hospitalization (including imaging, microbiology, ancillary and laboratory) are listed below for reference.   Imaging Studies: DG Tibia/Fibula Left Result Date: 11/05/2023 CLINICAL DATA:  Post reduction EXAM: LEFT TIBIA AND FIBULA - 2 VIEW COMPARISON:  11/05/2023 FINDINGS: Osteopenia. Casting material limits bone detail. Left knee replacement. Extensive vascular  calcifications. Acute mildly comminuted fracture distal shaft of fibula with about 1/4 shaft diameter lateral and anterior displacement of distal fracture fragment, mild apex posterior angulation. Acute comminuted fracture involving the shaft and metaphysis of the distal tibia with residual mild lateral angulation of distal fracture fragment and less than 1/4 shaft diameter lateral and anterior displacement of distal fracture fragment. IMPRESSION: Acute comminuted fractures of distal tibia and fibula with residual mild displacement and angulation. Electronically Signed   By: Luke Bun M.D.   On: 11/05/2023 21:12   DG Tibia/Fibula Left Result Date: 11/05/2023 CLINICAL DATA:  Deformity, possible twist or fall. EXAM: LEFT TIBIA AND FIBULA - 2 VIEW COMPARISON:  None Available. FINDINGS: There is diffusely decreased mineralization of the bones. There are mildly displaced comminuted fractures of the distal tibia and fibula  shafts. No dislocation is seen. Total knee arthroplasty changes are present without evidence of hardware loosening. No joint effusion is seen at the knee. Vascular calcifications are present in the soft tissues. IMPRESSION: Mildly displaced comminuted fractures of the distal tibia and fibula. Electronically Signed   By: Leita Birmingham M.D.   On: 11/05/2023 18:10    Microbiology: Results for orders placed or performed during the hospital encounter of 05/17/15  Blood culture (routine x 2)     Status: None   Collection Time: 05/17/15 12:50 AM   Specimen: BLOOD  Result Value Ref Range Status   Specimen Description BLOOD RIGHT ANTECUBITAL  Final   Special Requests BOTTLES DRAWN AEROBIC AND ANAEROBIC  Final   Culture NO GROWTH 5 DAYS  Final   Report Status 05/22/2015 FINAL  Final  Blood culture (routine x 2)     Status: None   Collection Time: 05/17/15 12:50 AM   Specimen: BLOOD  Result Value Ref Range Status   Specimen Description BLOOD RIGHT ARM  Final   Special Requests BOTTLES DRAWN AEROBIC AND ANAEROBIC  Final   Culture NO GROWTH 5 DAYS  Final   Report Status 05/22/2015 FINAL  Final    Labs: CBC: Recent Labs  Lab 11/05/23 1713 11/06/23 0419  WBC 8.8 6.3  NEUTROABS 6.1  --   HGB 9.6* 8.6*  HCT 30.8* 27.9*  MCV 96.9 95.2  PLT 158 139*   Basic Metabolic Panel: Recent Labs  Lab 11/05/23 1713 11/06/23 0419  NA 142 143  K 4.3 4.3  CL 106 109  CO2 27 26  GLUCOSE 98 91  BUN 49* 46*  CREATININE 1.15* 0.92  CALCIUM  7.9* 8.0*   Liver Function Tests: Recent Labs  Lab 11/05/23 1713  AST 17  ALT 11  ALKPHOS 66  BILITOT 0.6  PROT 4.5*  ALBUMIN 2.4*   CBG: No results for input(s): GLUCAP in the last 168 hours.  Discharge time spent: greater than 30 minutes.  Signed: Murvin Mana, MD [facility] 11/06/2023

## 2023-11-06 NOTE — ED Notes (Signed)
 Mansy, MD at bedside, verbal order for NS bolus to be given. Bolus started

## 2023-11-06 NOTE — ED Notes (Signed)
 MD aware of trending hypotension, no new orders.

## 2023-11-06 NOTE — Care Management CC44 (Signed)
 Condition Code 44 Documentation Completed  Patient Details  Name: Victoria Johnston MRN: [id number] Date of Birth: [birth year]   Condition Code 44 given:   yes Patient signature on Condition Code 44 notice:   ED SW contacted daughter [name] over the phone and explained code 64 Documentation of 2 MD's agreement:    Code 44 added to claim:       [name], LCSW 11/06/2023, 4:07 PM

## 2023-11-06 NOTE — Care Management Obs Status (Signed)
 MEDICARE OBSERVATION STATUS NOTIFICATION   Patient Details  Name: Victoria Johnston MRN: [id number] Date of Birth: 1923-07-21   Medicare Observation Status Notification Given:   yes    [name], LCSW 11/06/2023, 4:09 PM

## 2023-11-06 NOTE — Progress Notes (Signed)
 PHARMACIST - PHYSICIAN COMMUNICATION  CONCERNING:  Enoxaparin  (Lovenox ) for DVT Prophylaxis    RECOMMENDATION: Patient was prescribed enoxaprin 40mg  q24 hours for VTE prophylaxis.   Filed Weights   11/05/23 1704  Weight: 75.3 kg (166 lb)    Body mass index is 29.41 kg/m.  Estimated Creatinine Clearance: 26.6 mL/min (A) (by C-G formula based on SCr of 1.15 mg/dL (H)).  Patient is candidate for enoxaparin  30mg  every 24 hours based on CrCl <67ml/min or Weight <45kg  DESCRIPTION: Pharmacy has adjusted enoxaparin  dose per [facility] policy.  Patient is now receiving enoxaparin  30 mg every 24 hours   Rankin CANDIE Dills, PharmD, [facility] 11/06/2023 12:21 AM

## 2023-11-06 NOTE — Assessment & Plan Note (Addendum)
-   Status post reduction and splinting. - Orthopedic follow-up can be obtained later this admission or on outpatient basis. - Pain management will be provided

## 2023-11-06 NOTE — Assessment & Plan Note (Signed)
Will continue IV Rocephin and follow urine culture and sensitivity.

## 2023-11-06 NOTE — Assessment & Plan Note (Signed)
-   This is likely secondary to volume depletion and dehydration. - The patient be admitted to the medical telemetry bed. - Will continue hydration with IV normal saline. - Will monitor BP. - Will hold off antihypertensive therapy.

## 2023-11-06 NOTE — ED Notes (Signed)
 This RN called daughter suzen to give update on pt

## 2023-11-06 NOTE — Assessment & Plan Note (Signed)
Will continue Zoloft. 

## 2023-11-06 NOTE — Assessment & Plan Note (Signed)
-   Will continue Synthroid.

## 2023-11-06 NOTE — ED Notes (Signed)
 Fall bundle implemented. [place] socks placed on R foot only due to L foot being wrapped and splinted.

## 2023-11-06 NOTE — ED Notes (Signed)
 Called [facility] for transport to [facility]  spoke to [facility] for transport back.  1504

## 2023-11-06 NOTE — ED Notes (Signed)
 Ice pack applied to left leg for pain control/comfort

## 2023-11-06 NOTE — TOC CM/SW Note (Signed)
..  Transition of Care [facility]) - Inpatient Brief Assessment   Patient Details  Name: Victoria Johnston MRN: [id number] Date of Birth: January 20, 1924  Transition [facility]) CM/SW Contact:    [name], LCSW Phone Number: 11/06/2023, 4:11 PM   Clinical Narrative: ED SW met with pt at bedside in ED.  Pt not able to verbally answer questions.  SW contacted daughter Victoria Johnston to review code 22 and complete brief TOC assessment.  Per daughter pt has been in SNF since 2019 [facility]  No SDOH needs or concerns identified.  No safety concerns expressed or identified.    Transition of Care Asessment: Insurance and Status: Insurance coverage has been reviewed Patient has primary care physician: Yes Home environment has been reviewed: lives in SNF Prior level of function:: SNF Prior/Current Home Services: No current home services Social Drivers of Health Review: SDOH reviewed no interventions necessary Readmission risk has been reviewed: Yes Transition of care needs: no transition of care needs at this time

## 2023-11-06 NOTE — Assessment & Plan Note (Addendum)
Will continue Eliquis. 

## 2023-11-09 LAB — URINE CULTURE: Culture: 30000 — AB
